# Patient Record
Sex: Male | Born: 1990 | Race: White | Hispanic: No | Marital: Single | Smoking: Former smoker
Health system: Southern US, Community
[De-identification: ages and names within clinical notes are randomized; demographics above are authoritative.]

## PROBLEM LIST (undated history)

## (undated) HISTORY — PX: APPENDECTOMY: SHX54

---

## 2020-09-01 ENCOUNTER — Other Ambulatory Visit: Payer: Self-pay

## 2020-09-01 ENCOUNTER — Observation Stay (HOSPITAL_COMMUNITY)
Admission: EM | Admit: 2020-09-01 | Discharge: 2020-09-02 | Disposition: A | Discharge status: Home / Self Care | Payer: 59 | Attending: Physician Assistant | Admitting: Physician Assistant

## 2020-09-01 ENCOUNTER — Emergency Department (HOSPITAL_COMMUNITY): Payer: 59

## 2020-09-01 ENCOUNTER — Encounter (HOSPITAL_COMMUNITY): Payer: Self-pay

## 2020-09-01 DIAGNOSIS — Z20822 Contact with and (suspected) exposure to covid-19: Secondary | ICD-10-CM | POA: Insufficient documentation

## 2020-09-01 DIAGNOSIS — K3580 Unspecified acute appendicitis: Secondary | ICD-10-CM | POA: Diagnosis not present

## 2020-09-01 DIAGNOSIS — K353 Acute appendicitis with localized peritonitis, without perforation or gangrene: Secondary | ICD-10-CM

## 2020-09-01 DIAGNOSIS — R1031 Right lower quadrant pain: Secondary | ICD-10-CM | POA: Diagnosis present

## 2020-09-01 LAB — CBC WITH DIFFERENTIAL/PLATELET
Abs Immature Granulocytes: 0.04 10*3/uL (ref 0.00–0.07)
Basophils Absolute: 0 10*3/uL (ref 0.0–0.1)
Basophils Relative: 0 %
Eosinophils Absolute: 0 10*3/uL (ref 0.0–0.5)
Eosinophils Relative: 0 %
HCT: 44.4 % (ref 39.0–52.0)
Hemoglobin: 15.6 g/dL (ref 13.0–17.0)
Immature Granulocytes: 0 %
Lymphocytes Relative: 24 %
Lymphs Abs: 2.7 10*3/uL (ref 0.7–4.0)
MCH: 30.8 pg (ref 26.0–34.0)
MCHC: 35.1 g/dL (ref 30.0–36.0)
MCV: 87.6 fL (ref 80.0–100.0)
Monocytes Absolute: 0.7 10*3/uL (ref 0.1–1.0)
Monocytes Relative: 6 %
Neutro Abs: 7.7 10*3/uL (ref 1.7–7.7)
Neutrophils Relative %: 70 %
Platelets: 286 10*3/uL (ref 150–400)
RBC: 5.07 MIL/uL (ref 4.22–5.81)
RDW: 11.9 % (ref 11.5–15.5)
WBC: 11.1 10*3/uL — ABNORMAL HIGH (ref 4.0–10.5)
nRBC: 0 % (ref 0.0–0.2)

## 2020-09-01 LAB — URINALYSIS, ROUTINE W REFLEX MICROSCOPIC
Bilirubin Urine: NEGATIVE
Glucose, UA: NEGATIVE mg/dL
Hgb urine dipstick: NEGATIVE
Ketones, ur: NEGATIVE mg/dL
Leukocytes,Ua: NEGATIVE
Nitrite: NEGATIVE
Protein, ur: NEGATIVE mg/dL
Specific Gravity, Urine: 1.008 (ref 1.005–1.030)
pH: 6 (ref 5.0–8.0)

## 2020-09-01 LAB — COMPREHENSIVE METABOLIC PANEL
ALT: 45 U/L — ABNORMAL HIGH (ref 0–44)
AST: 22 U/L (ref 15–41)
Albumin: 4.6 g/dL (ref 3.5–5.0)
Alkaline Phosphatase: 69 U/L (ref 38–126)
Anion gap: 10 (ref 5–15)
BUN: 6 mg/dL (ref 6–20)
CO2: 28 mmol/L (ref 22–32)
Calcium: 9.9 mg/dL (ref 8.9–10.3)
Chloride: 100 mmol/L (ref 98–111)
Creatinine, Ser: 0.87 mg/dL (ref 0.61–1.24)
GFR, Estimated: >60 mL/min (ref >60)
Glucose, Bld: 96 mg/dL (ref 70–99)
Potassium: 3.3 mmol/L — ABNORMAL LOW (ref 3.5–5.1)
Sodium: 138 mmol/L (ref 135–145)
Total Bilirubin: 1.3 mg/dL — ABNORMAL HIGH (ref 0.3–1.2)
Total Protein: 7.7 g/dL (ref 6.5–8.1)

## 2020-09-01 LAB — LIPASE, BLOOD: Lipase: 24 U/L (ref 11–51)

## 2020-09-01 MED ORDER — KCL IN DEXTROSE-NACL 20-5-0.45 MEQ/L-%-% IV SOLN
INTRAVENOUS | Status: DC
  Filled 2020-09-01: qty 1000

## 2020-09-01 MED ORDER — PIPERACILLIN-TAZOBACTAM 3.375 G IVPB
3.3750 g | Freq: Three times a day (TID) | INTRAVENOUS | Status: DC
  Administered 2020-09-02: 3.375 g via INTRAVENOUS
  Filled 2020-09-01: qty 50

## 2020-09-01 MED ORDER — ONDANSETRON HCL 4 MG/2ML IJ SOLN: 4.0000 mg | Freq: Four times a day (QID) | INTRAMUSCULAR | Status: DC | PRN

## 2020-09-01 MED ORDER — ENOXAPARIN SODIUM 40 MG/0.4ML ~~LOC~~ SOLN: 40.0000 mg | SUBCUTANEOUS | Status: DC

## 2020-09-01 MED ORDER — IOHEXOL 300 MG/ML  SOLN
100.0000 mL | Freq: Once | INTRAMUSCULAR | Status: AC | PRN
  Administered 2020-09-01: 100 mL via INTRAVENOUS

## 2020-09-01 MED ORDER — ACETAMINOPHEN 325 MG PO TABS: 650.0000 mg | ORAL_TABLET | Freq: Four times a day (QID) | ORAL | Status: DC | PRN

## 2020-09-01 MED ORDER — METOPROLOL TARTRATE 5 MG/5ML IV SOLN: 5.0000 mg | Freq: Four times a day (QID) | INTRAVENOUS | Status: DC | PRN

## 2020-09-01 MED ORDER — ONDANSETRON 4 MG PO TBDP: 4.0000 mg | ORAL_TABLET | Freq: Four times a day (QID) | ORAL | Status: DC | PRN

## 2020-09-01 MED ORDER — PIPERACILLIN-TAZOBACTAM 3.375 G IVPB 30 MIN
3.3750 g | Freq: Once | INTRAVENOUS | Status: AC
  Administered 2020-09-01: 3.375 g via INTRAVENOUS
  Filled 2020-09-01: qty 50

## 2020-09-01 MED ORDER — SODIUM CHLORIDE 0.9 % IV BOLUS
1000.0000 mL | Freq: Once | INTRAVENOUS | Status: AC
  Administered 2020-09-01: 1000 mL via INTRAVENOUS

## 2020-09-01 MED ORDER — MORPHINE SULFATE (PF) 2 MG/ML IV SOLN: 2.0000 mg | INTRAVENOUS | Status: DC | PRN

## 2020-09-01 MED ORDER — POLYETHYLENE GLYCOL 3350 17 G PO PACK: 17.0000 g | PACK | Freq: Every day | ORAL | Status: DC | PRN

## 2020-09-01 MED ORDER — OXYCODONE HCL 5 MG PO TABS: 5.0000 mg | ORAL_TABLET | Freq: Four times a day (QID) | ORAL | Status: DC | PRN

## 2020-09-01 MED ORDER — ACETAMINOPHEN 650 MG RE SUPP: 650.0000 mg | Freq: Four times a day (QID) | RECTAL | Status: DC | PRN

## 2020-09-01 NOTE — ED Notes (Signed)
Pt taken to CT.

## 2020-09-01 NOTE — H&P (Signed)
Reason for Consult: abdominal pain Referring Physician: Caydin Yeatts is an 29 y.o. male.  HPI: 29 yo male with 1 day of right sided pain. Pain is dull constant ache. Pain medications help. It is worse with movement. It is not associated with food. He denies nausea or vomiting. He denies diarrhea. He denies fevers.  History reviewed. No pertinent past medical history.  History reviewed. No pertinent surgical history.  No family history on file.  Social History:  has no history on file for tobacco use, alcohol use, and drug use.  Allergies: Not on File  Medications: I have reviewed the patient's current medications.  Results for orders placed or performed during the hospital encounter of 09/01/20 (from the past 48 hour(s))  Comprehensive metabolic panel     Status: Abnormal   Collection Time: 09/01/20  7:00 PM  Result Value Ref Range   Sodium 138 135 - 145 mmol/L   Potassium 3.3 (L) 3.5 - 5.1 mmol/L   Chloride 100 98 - 111 mmol/L   CO2 28 22 - 32 mmol/L   Glucose, Bld 96 70 - 99 mg/dL    Comment: Glucose reference range applies only to samples taken after fasting for at least 8 hours.   BUN 6 6 - 20 mg/dL   Creatinine, Ser 4.85 0.61 - 1.24 mg/dL   Calcium 9.9 8.9 - 46.2 mg/dL   Total Protein 7.7 6.5 - 8.1 g/dL   Albumin 4.6 3.5 - 5.0 g/dL   AST 22 15 - 41 U/L   ALT 45 (H) 0 - 44 U/L   Alkaline Phosphatase 69 38 - 126 U/L   Total Bilirubin 1.3 (H) 0.3 - 1.2 mg/dL   GFR, Estimated >70 >35 mL/min    Comment: (NOTE) Calculated using the CKD-EPI Creatinine Equation (2021)    Anion gap 10 5 - 15    Comment: Performed at [facility], [address] [place], [place] [address]  CBC with Differential     Status: Abnormal   Collection Time: 09/01/20  7:00 PM  Result Value Ref Range   WBC 11.1 (H) 4.0 - 10.5 K/uL   RBC 5.07 4.22 - 5.81 MIL/uL   Hemoglobin 15.6 13.0 - 17.0 g/dL   HCT 18.2 39 - 52 %   MCV 87.6 80.0 - 100.0 fL   MCH 30.8 26.0 - 34.0 pg    MCHC 35.1 30.0 - 36.0 g/dL   RDW 99.3 71.6 - 96.7 %   Platelets 286 150 - 400 K/uL   nRBC 0.0 0.0 - 0.2 %   Neutrophils Relative % 70 %   Neutro Abs 7.7 1.7 - 7.7 K/uL   Lymphocytes Relative 24 %   Lymphs Abs 2.7 0.7 - 4.0 K/uL   Monocytes Relative 6 %   Monocytes Absolute 0.7 0.1 - 1.0 K/uL   Eosinophils Relative 0 %   Eosinophils Absolute 0.0 0.0 - 0.5 K/uL   Basophils Relative 0 %   Basophils Absolute 0.0 0.0 - 0.1 K/uL   Immature Granulocytes 0 %   Abs Immature Granulocytes 0.04 0.00 - 0.07 K/uL    Comment: Performed at [facility], [address] [place], [place] [address]  Lipase, blood     Status: None   Collection Time: 09/01/20  7:00 PM  Result Value Ref Range   Lipase 24 11 - 51 U/L    Comment: Performed at [facility], [address], [place], [place] [address]  Urinalysis,  Routine w reflex microscopic Urine, Clean Catch     Status: None   Collection Time: 09/01/20  7:42 PM  Result Value Ref Range   Color, Urine YELLOW YELLOW   APPearance CLEAR CLEAR   Specific Gravity, Urine 1.008 1.005 - 1.030   pH 6.0 5.0 - 8.0   Glucose, UA NEGATIVE NEGATIVE mg/dL   Hgb urine dipstick NEGATIVE NEGATIVE   Bilirubin Urine NEGATIVE NEGATIVE   Ketones, ur NEGATIVE NEGATIVE mg/dL   Protein, ur NEGATIVE NEGATIVE mg/dL   Nitrite NEGATIVE NEGATIVE   Leukocytes,Ua NEGATIVE NEGATIVE    Comment: Performed at [facility], [address], [place], [place] [address]    CT Abdomen Pelvis W Contrast  Result Date: 09/01/2020 CLINICAL DATA:  Right lower quadrant abdominal pain EXAM: CT ABDOMEN AND PELVIS WITH CONTRAST TECHNIQUE: Multidetector CT imaging of the abdomen and pelvis was performed using the standard protocol following bolus administration of intravenous contrast. CONTRAST:  OMNIPAQUE IOHEXOL 300 MG/ML  SOLN COMPARISON:  None. FINDINGS: Lower chest: The visualized heart size within normal limits. No pericardial fluid/thickening. No hiatal hernia. The  visualized portions of the lungs are clear. Hepatobiliary: The liver is normal in density without focal abnormality.The main portal vein is patent. No evidence of calcified gallstones, gallbladder wall thickening or biliary dilatation. Pancreas: Unremarkable. No pancreatic ductal dilatation or surrounding inflammatory changes. Spleen: Normal in size without focal abnormality. Adrenals/Urinary Tract: Both adrenal glands appear normal. The kidneys and collecting system appear normal without evidence of urinary tract calculus or hydronephrosis. Bladder is unremarkable. Stomach/Bowel: The stomach, small bowel, and colon are normal in appearance. No inflammatory changes, wall thickening, or obstructive findings.The appendix is and large with wall thickening measuring up to 8 mm in transverse dimension. Surrounding fat stranding changes are seen. Scattered small lymph nodes are seen within the right lower quadrant. No loculated fluid collection or free air. Vascular/Lymphatic: There are no enlarged mesenteric, retroperitoneal, or pelvic lymph nodes. No significant vascular findings are present. Reproductive: Other: No evidence of abdominal wall mass or hernia. Musculoskeletal: No acute or significant osseous findings. IMPRESSION: Findings consistent with acute uncomplicated appendicitis. No surrounding loculated fluid collections or free air. Electronically Signed   By: Jonna Clark M.D.   On: 09/01/2020 21:32    ROS  PE Blood pressure (!) 142/99, pulse (!) 104, temperature 99.2 F (37.3 C), temperature source Oral, resp. rate (!) 26, height 5\' 10"  (1.778 m), weight 83.9 kg, SpO2 98 %. Constitutional: NAD; conversant; no deformities Eyes: Moist conjunctiva; no lid lag; anicteric; PERRL Neck: Trachea midline; no thyromegaly Lungs: Normal respiratory effort; no tactile fremitus CV: RRR; no palpable thrills; no pitting edema GI: Abd tender to palpation over right lateral area; no palpable  hepatosplenomegaly MSK: unable to assess gait; no clubbing/cyanosis Psychiatric: Appropriate affect; alert and oriented x3 Lymphatic: No palpable cervical or axillary lymphadenopathy Skin: No major subcutaneous nodules. Warm and dry   Assessment/Plan: 29 yo male with acute appendicitis -IV zosyn -observation -OR in am -COVID screening test -we discussed the details of the procedure; that it would be done under general anesthesia, that we would attempt to do the procedure laparoscopically. That the appendix would be isolated from the large and small intestine and then ligated and removed. We discussed the reason for this is to avoid rupture and infection and resolve the pains. We discussed risks of infection, abscess, injury to intestines or urinary structures, and need for open incision. He showed good understanding and wanted to proceed.  De Blanch Cornelio Parkerson 09/01/2020, 10:40 PM

## 2020-09-01 NOTE — ED Provider Notes (Signed)
[facility] EMERGENCY DEPARTMENT Provider Note   CSN: [id number] Arrival date & time: 09/01/20  1842     History Chief Complaint  Patient presents with  . Abdominal Pain  . Back Pain    Riley Hallum is a 29 y.o. male.  The history is provided by the patient.  Abdominal Pain Back Pain Associated symptoms: abdominal pain    Artha Chiasson is a 29 y.o. male who presents to the Emergency Department complaining of abdominal pain. He presents the emergency department for evaluation of right sided abdominal pain that radiates to his back. Symptoms started early this morning. He has decreased appetite, last eight last night. He denies any fevers, nausea, vomiting, dysuria. Symptoms are moderate and constant nature. He went to urgent care and was referred to the emergency department for possible appendicitis.    History reviewed. No pertinent past medical history.  There are no problems to display for this patient.   History reviewed. No pertinent surgical history.     No family history on file.  Social History   Tobacco Use  . Smoking status: Not on file  Substance Use Topics  . Alcohol use: Not on file  . Drug use: Not on file    Home Medications Prior to Admission medications   Not on File    Allergies    Patient has no allergy information on record.  Review of Systems   Review of Systems  Gastrointestinal: Positive for abdominal pain.  Musculoskeletal: Positive for back pain.  All other systems reviewed and are negative.   Physical Exam Updated Vital Signs BP (!) 142/99   Pulse (!) 104   Temp 99.2 F (37.3 C) (Oral)   Resp (!) 26   Ht 5\' 10"  (1.778 m)   Wt 83.9 kg   SpO2 98%   BMI 26.54 kg/m   Physical Exam Vitals and nursing note reviewed.  Constitutional:      Appearance: He is well-developed.  HENT:     Head: Normocephalic and atraumatic.  Cardiovascular:     Rate and Rhythm: Regular rhythm. Tachycardia present.     Heart  sounds: No murmur heard.   Pulmonary:     Effort: Pulmonary effort is normal. No respiratory distress.     Breath sounds: Normal breath sounds.  Abdominal:     Palpations: Abdomen is soft.     Tenderness: There is abdominal tenderness. There is no guarding or rebound.     Comments: Mild to moderate right lower quadrant and right flank tenderness  Musculoskeletal:        General: No tenderness.  Skin:    General: Skin is warm and dry.  Neurological:     Mental Status: He is alert and oriented to person, place, and time.  Psychiatric:        Behavior: Behavior normal.     ED Results / Procedures / Treatments   Labs (all labs ordered are listed, but only abnormal results are displayed) Labs Reviewed  COMPREHENSIVE METABOLIC PANEL - Abnormal; Notable for the following components:      Result Value   Potassium 3.3 (*)    ALT 45 (*)    Total Bilirubin 1.3 (*)    All other components within normal limits  CBC WITH DIFFERENTIAL/PLATELET - Abnormal; Notable for the following components:   WBC 11.1 (*)    All other components within normal limits  RESPIRATORY PANEL BY RT PCR (FLU A&B, COVID)  URINALYSIS, ROUTINE W REFLEX MICROSCOPIC  LIPASE, BLOOD    EKG None  Radiology CT Abdomen Pelvis W Contrast  Result Date: 09/01/2020 CLINICAL DATA:  Right lower quadrant abdominal pain EXAM: CT ABDOMEN AND PELVIS WITH CONTRAST TECHNIQUE: Multidetector CT imaging of the abdomen and pelvis was performed using the standard protocol following bolus administration of intravenous contrast. CONTRAST:  OMNIPAQUE IOHEXOL 300 MG/ML  SOLN COMPARISON:  None. FINDINGS: Lower chest: The visualized heart size within normal limits. No pericardial fluid/thickening. No hiatal hernia. The visualized portions of the lungs are clear. Hepatobiliary: The liver is normal in density without focal abnormality.The main portal vein is patent. No evidence of calcified gallstones, gallbladder wall thickening or  biliary dilatation. Pancreas: Unremarkable. No pancreatic ductal dilatation or surrounding inflammatory changes. Spleen: Normal in size without focal abnormality. Adrenals/Urinary Tract: Both adrenal glands appear normal. The kidneys and collecting system appear normal without evidence of urinary tract calculus or hydronephrosis. Bladder is unremarkable. Stomach/Bowel: The stomach, small bowel, and colon are normal in appearance. No inflammatory changes, wall thickening, or obstructive findings.The appendix is and large with wall thickening measuring up to 8 mm in transverse dimension. Surrounding fat stranding changes are seen. Scattered small lymph nodes are seen within the right lower quadrant. No loculated fluid collection or free air. Vascular/Lymphatic: There are no enlarged mesenteric, retroperitoneal, or pelvic lymph nodes. No significant vascular findings are present. Reproductive: Other: No evidence of abdominal wall mass or hernia. Musculoskeletal: No acute or significant osseous findings. IMPRESSION: Findings consistent with acute uncomplicated appendicitis. No surrounding loculated fluid collections or free air. Electronically Signed   By: Jonna Clark M.D.   On: 09/01/2020 21:32    Procedures Procedures (including critical care time)  Medications Ordered in ED Medications  piperacillin-tazobactam (ZOSYN) IVPB 3.375 g (has no administration in time range)  sodium chloride 0.9 % bolus 1,000 mL (has no administration in time range)  iohexol (OMNIPAQUE) 300 MG/ML solution 100 mL (100 mLs Intravenous Contrast Given 09/01/20 2121)    ED Course  I have reviewed the triage vital signs and the nursing notes.  Pertinent labs & imaging results that were available during my care of the patient were reviewed by me and considered in my medical decision making (see chart for details).    MDM Rules/Calculators/A&P                         patient here for evaluation of abdominal pain since this  morning. He has tenderness on examination. CBC with mild leukocytosis. CT abdomen pelvis obtained, which is concerning for acute uncomplicated appendicitis. Discussed with patient findings of studies. Will treat with antibiotics, IV fluids. Surgery consulted for admission for ongoing treatment.  Final Clinical Impression(s) / ED Diagnoses Final diagnoses:  None    Rx / DC Orders ED Discharge Orders    None       Tilden Fossa, MD 09/01/20 2205

## 2020-09-01 NOTE — ED Triage Notes (Signed)
Pt sent from UC for further evaluation of right sided abd pain that radiates to his back, concerned for appendicitis. No n/v, but no appetite.

## 2020-09-02 ENCOUNTER — Observation Stay (HOSPITAL_COMMUNITY): Payer: 59 | Admitting: Certified Registered Nurse Anesthetist

## 2020-09-02 ENCOUNTER — Encounter (HOSPITAL_COMMUNITY): Payer: Self-pay

## 2020-09-02 ENCOUNTER — Encounter (HOSPITAL_COMMUNITY)
Admission: EM | Disposition: A | Discharge status: Home / Self Care | Payer: Self-pay | Source: Home / Self Care | Attending: Emergency Medicine

## 2020-09-02 HISTORY — PX: LAPAROSCOPIC APPENDECTOMY: SHX408

## 2020-09-02 LAB — SURGICAL PCR SCREEN
MRSA, PCR: NEGATIVE
Staphylococcus aureus: NEGATIVE

## 2020-09-02 LAB — CBC
HCT: 39.9 % (ref 39.0–52.0)
HCT: 43.4 % (ref 39.0–52.0)
Hemoglobin: 14.3 g/dL (ref 13.0–17.0)
Hemoglobin: 15.1 g/dL (ref 13.0–17.0)
MCH: 31.6 pg (ref 26.0–34.0)
MCH: 30.5 pg (ref 26.0–34.0)
MCHC: 35.8 g/dL (ref 30.0–36.0)
MCHC: 34.8 g/dL (ref 30.0–36.0)
MCV: 88.1 fL (ref 80.0–100.0)
MCV: 87.7 fL (ref 80.0–100.0)
Platelets: 234 10*3/uL (ref 150–400)
Platelets: 266 10*3/uL (ref 150–400)
RBC: 4.53 MIL/uL (ref 4.22–5.81)
RBC: 4.95 MIL/uL (ref 4.22–5.81)
RDW: 12 % (ref 11.5–15.5)
RDW: 12 % (ref 11.5–15.5)
WBC: 8.4 10*3/uL (ref 4.0–10.5)
WBC: 11.1 10*3/uL — ABNORMAL HIGH (ref 4.0–10.5)
nRBC: 0 % (ref 0.0–0.2)
nRBC: 0 % (ref 0.0–0.2)

## 2020-09-02 LAB — COMPREHENSIVE METABOLIC PANEL
ALT: 39 U/L (ref 0–44)
AST: 22 U/L (ref 15–41)
Albumin: 3.8 g/dL (ref 3.5–5.0)
Alkaline Phosphatase: 53 U/L (ref 38–126)
Anion gap: 9 (ref 5–15)
BUN: 6 mg/dL (ref 6–20)
CO2: 26 mmol/L (ref 22–32)
Calcium: 9.1 mg/dL (ref 8.9–10.3)
Chloride: 104 mmol/L (ref 98–111)
Creatinine, Ser: 0.84 mg/dL (ref 0.61–1.24)
GFR, Estimated: >60 mL/min (ref >60)
Glucose, Bld: 123 mg/dL — ABNORMAL HIGH (ref 70–99)
Potassium: 3.5 mmol/L (ref 3.5–5.1)
Sodium: 139 mmol/L (ref 135–145)
Total Bilirubin: 1 mg/dL (ref 0.3–1.2)
Total Protein: 6.3 g/dL — ABNORMAL LOW (ref 6.5–8.1)

## 2020-09-02 LAB — CREATININE, SERUM
Creatinine, Ser: 0.81 mg/dL (ref 0.61–1.24)
GFR, Estimated: >60 mL/min (ref >60)

## 2020-09-02 LAB — RESPIRATORY PANEL BY RT PCR (FLU A&B, COVID)
Influenza A by PCR: NEGATIVE
Influenza B by PCR: NEGATIVE
SARS Coronavirus 2 by RT PCR: NEGATIVE

## 2020-09-02 LAB — HIV ANTIBODY (ROUTINE TESTING W REFLEX): HIV Screen 4th Generation wRfx: NONREACTIVE

## 2020-09-02 SURGERY — APPENDECTOMY, LAPAROSCOPIC
Anesthesia: General | Site: Abdomen

## 2020-09-02 MED ORDER — HYDROCODONE-ACETAMINOPHEN 5-325 MG PO TABS: 1.0000 | ORAL_TABLET | Freq: Four times a day (QID) | ORAL | 0 refills | Status: DC | PRN

## 2020-09-02 MED ORDER — ENSURE PRE-SURGERY PO LIQD
296.0000 mL | Freq: Once | ORAL | Status: AC
  Administered 2020-09-02: 296 mL via ORAL
  Filled 2020-09-02: qty 296

## 2020-09-02 MED ORDER — HYDROMORPHONE HCL 1 MG/ML IJ SOLN: 0.2500 mg | INTRAMUSCULAR | Status: DC | PRN

## 2020-09-02 MED ORDER — DIPHENHYDRAMINE HCL 50 MG/ML IJ SOLN: 25.0000 mg | Freq: Four times a day (QID) | INTRAMUSCULAR | Status: DC | PRN

## 2020-09-02 MED ORDER — PROMETHAZINE HCL 25 MG/ML IJ SOLN: 6.2500 mg | INTRAMUSCULAR | Status: DC | PRN

## 2020-09-02 MED ORDER — POLYETHYLENE GLYCOL 3350 17 G PO PACK: 17.0000 g | PACK | Freq: Every day | ORAL | Status: DC | PRN

## 2020-09-02 MED ORDER — OXYCODONE HCL 5 MG PO TABS
ORAL_TABLET | ORAL | Status: AC
  Filled 2020-09-02: qty 1

## 2020-09-02 MED ORDER — SUGAMMADEX SODIUM 200 MG/2ML IV SOLN
INTRAVENOUS | Status: DC | PRN
  Administered 2020-09-02: 200 mg via INTRAVENOUS
  Administered 2020-09-02: 100 mg via INTRAVENOUS

## 2020-09-02 MED ORDER — FENTANYL CITRATE (PF) 250 MCG/5ML IJ SOLN
INTRAMUSCULAR | Status: AC
  Filled 2020-09-02: qty 5

## 2020-09-02 MED ORDER — HYDROMORPHONE HCL 1 MG/ML IJ SOLN
INTRAMUSCULAR | Status: AC
  Filled 2020-09-02: qty 1

## 2020-09-02 MED ORDER — PROPOFOL 10 MG/ML IV BOLUS
INTRAVENOUS | Status: AC
  Filled 2020-09-02: qty 20

## 2020-09-02 MED ORDER — IBUPROFEN 600 MG PO TABS: 600.0000 mg | ORAL_TABLET | Freq: Four times a day (QID) | ORAL | Status: DC | PRN

## 2020-09-02 MED ORDER — OXYCODONE HCL 5 MG PO TABS: 5.0000 mg | ORAL_TABLET | ORAL | Status: DC | PRN

## 2020-09-02 MED ORDER — FENTANYL CITRATE (PF) 250 MCG/5ML IJ SOLN
INTRAMUSCULAR | Status: DC | PRN
  Administered 2020-09-02: 50 ug via INTRAVENOUS
  Administered 2020-09-02: 100 ug via INTRAVENOUS

## 2020-09-02 MED ORDER — LACTATED RINGERS IV SOLN: INTRAVENOUS | Status: DC | PRN

## 2020-09-02 MED ORDER — ACETAMINOPHEN 500 MG PO TABS: 500.0000 mg | ORAL_TABLET | Freq: Four times a day (QID) | ORAL | Status: AC | PRN

## 2020-09-02 MED ORDER — 0.9 % SODIUM CHLORIDE (POUR BTL) OPTIME
TOPICAL | Status: DC | PRN
  Administered 2020-09-02: 1000 mL

## 2020-09-02 MED ORDER — MIDAZOLAM HCL 2 MG/2ML IJ SOLN
INTRAMUSCULAR | Status: AC
  Filled 2020-09-02: qty 2

## 2020-09-02 MED ORDER — MIDAZOLAM HCL 5 MG/5ML IJ SOLN
INTRAMUSCULAR | Status: DC | PRN
  Administered 2020-09-02: 2 mg via INTRAVENOUS

## 2020-09-02 MED ORDER — KETOROLAC TROMETHAMINE 15 MG/ML IJ SOLN
15.0000 mg | INTRAMUSCULAR | Status: AC
  Administered 2020-09-02: 15 mg via INTRAVENOUS
  Filled 2020-09-02: qty 1

## 2020-09-02 MED ORDER — SIMETHICONE 80 MG PO CHEW: 40.0000 mg | CHEWABLE_TABLET | Freq: Four times a day (QID) | ORAL | Status: DC | PRN

## 2020-09-02 MED ORDER — MORPHINE SULFATE (PF) 2 MG/ML IV SOLN: 2.0000 mg | INTRAVENOUS | Status: DC | PRN

## 2020-09-02 MED ORDER — SODIUM CHLORIDE 0.9 % IR SOLN
Status: DC | PRN
  Administered 2020-09-02: 1000 mL

## 2020-09-02 MED ORDER — LACTATED RINGERS IV SOLN: INTRAVENOUS | Status: DC

## 2020-09-02 MED ORDER — DEXMEDETOMIDINE (PRECEDEX) IN NS 20 MCG/5ML (4 MCG/ML) IV SYRINGE
PREFILLED_SYRINGE | INTRAVENOUS | Status: AC
  Filled 2020-09-02: qty 5

## 2020-09-02 MED ORDER — CHLORHEXIDINE GLUCONATE 0.12 % MT SOLN
15.0000 mL | OROMUCOSAL | Status: DC
  Filled 2020-09-02: qty 15

## 2020-09-02 MED ORDER — DIPHENHYDRAMINE HCL 25 MG PO CAPS: 25.0000 mg | ORAL_CAPSULE | Freq: Four times a day (QID) | ORAL | Status: DC | PRN

## 2020-09-02 MED ORDER — GABAPENTIN 300 MG PO CAPS: 300.0000 mg | ORAL_CAPSULE | Freq: Two times a day (BID) | ORAL | Status: DC

## 2020-09-02 MED ORDER — KETOROLAC TROMETHAMINE 30 MG/ML IJ SOLN: 30.0000 mg | Freq: Once | INTRAMUSCULAR | Status: DC | PRN

## 2020-09-02 MED ORDER — ACETAMINOPHEN 500 MG PO TABS
1000.0000 mg | ORAL_TABLET | ORAL | Status: AC
  Administered 2020-09-02: 1000 mg via ORAL
  Filled 2020-09-02: qty 2

## 2020-09-02 MED ORDER — BUPIVACAINE HCL 0.25 % IJ SOLN
INTRAMUSCULAR | Status: DC | PRN
  Administered 2020-09-02: 10 mL

## 2020-09-02 MED ORDER — METOPROLOL TARTRATE 5 MG/5ML IV SOLN: 5.0000 mg | Freq: Four times a day (QID) | INTRAVENOUS | Status: DC | PRN

## 2020-09-02 MED ORDER — LIDOCAINE 2% (20 MG/ML) 5 ML SYRINGE
INTRAMUSCULAR | Status: DC | PRN
  Administered 2020-09-02: 40 mg via INTRAVENOUS

## 2020-09-02 MED ORDER — ACETAMINOPHEN 500 MG PO TABS: 1000.0000 mg | ORAL_TABLET | Freq: Four times a day (QID) | ORAL | Status: DC

## 2020-09-02 MED ORDER — CHLORHEXIDINE GLUCONATE 0.12 % MT SOLN
OROMUCOSAL | Status: AC
  Administered 2020-09-02: 15 mL via OROMUCOSAL
  Filled 2020-09-02: qty 15

## 2020-09-02 MED ORDER — OXYCODONE HCL 5 MG PO TABS: 5.0000 mg | ORAL_TABLET | Freq: Once | ORAL | Status: DC | PRN

## 2020-09-02 MED ORDER — CHLORHEXIDINE GLUCONATE 0.12 % MT SOLN: 15.0000 mL | Freq: Once | OROMUCOSAL | Status: AC

## 2020-09-02 MED ORDER — MEPERIDINE HCL 25 MG/ML IJ SOLN: 6.2500 mg | INTRAMUSCULAR | Status: DC | PRN

## 2020-09-02 MED ORDER — KCL IN DEXTROSE-NACL 20-5-0.45 MEQ/L-%-% IV SOLN: INTRAVENOUS | Status: DC

## 2020-09-02 MED ORDER — ENOXAPARIN SODIUM 40 MG/0.4ML ~~LOC~~ SOLN: 40.0000 mg | SUBCUTANEOUS | Status: DC

## 2020-09-02 MED ORDER — ROCURONIUM BROMIDE 100 MG/10ML IV SOLN
INTRAVENOUS | Status: DC | PRN
  Administered 2020-09-02: 60 mg via INTRAVENOUS

## 2020-09-02 MED ORDER — BUPIVACAINE HCL (PF) 0.25 % IJ SOLN
INTRAMUSCULAR | Status: AC
  Filled 2020-09-02: qty 30

## 2020-09-02 MED ORDER — OXYCODONE HCL 5 MG/5ML PO SOLN: 5.0000 mg | Freq: Once | ORAL | Status: DC | PRN

## 2020-09-02 MED ORDER — PROPOFOL 10 MG/ML IV BOLUS
INTRAVENOUS | Status: DC | PRN
  Administered 2020-09-02: 200 mg via INTRAVENOUS

## 2020-09-02 MED ORDER — ONDANSETRON 4 MG PO TBDP: 4.0000 mg | ORAL_TABLET | Freq: Four times a day (QID) | ORAL | Status: DC | PRN

## 2020-09-02 MED ORDER — DEXAMETHASONE SODIUM PHOSPHATE 10 MG/ML IJ SOLN
INTRAMUSCULAR | Status: DC | PRN
  Administered 2020-09-02: 5 mg via INTRAVENOUS

## 2020-09-02 MED ORDER — GABAPENTIN 300 MG PO CAPS
300.0000 mg | ORAL_CAPSULE | ORAL | Status: AC
  Administered 2020-09-02: 300 mg via ORAL
  Filled 2020-09-02: qty 1

## 2020-09-02 MED ORDER — ONDANSETRON HCL 4 MG/2ML IJ SOLN
INTRAMUSCULAR | Status: DC | PRN
  Administered 2020-09-02: 4 mg via INTRAVENOUS

## 2020-09-02 MED ORDER — DEXMEDETOMIDINE (PRECEDEX) IN NS 20 MCG/5ML (4 MCG/ML) IV SYRINGE
PREFILLED_SYRINGE | INTRAVENOUS | Status: DC | PRN
  Administered 2020-09-02: 8 ug via INTRAVENOUS

## 2020-09-02 MED ORDER — ONDANSETRON HCL 4 MG/2ML IJ SOLN: 4.0000 mg | Freq: Four times a day (QID) | INTRAMUSCULAR | Status: DC | PRN

## 2020-09-02 SURGICAL SUPPLY — 40 items
APPLIER CLIP 5 13 M/L LIGAMAX5 (MISCELLANEOUS)
BLADE CLIPPER SURG (BLADE) ×3 IMPLANT
CANISTER SUCT 3000ML PPV (MISCELLANEOUS) ×3 IMPLANT
CHLORAPREP W/TINT 26 (MISCELLANEOUS) ×3 IMPLANT
CLIP APPLIE 5 13 M/L LIGAMAX5 (MISCELLANEOUS) IMPLANT
CLIP VESOLOCK XL 6/CT (CLIP) ×3 IMPLANT
COVER SURGICAL LIGHT HANDLE (MISCELLANEOUS) ×3 IMPLANT
COVER TRANSDUCER ULTRASND (DRAPES) ×3 IMPLANT
DERMABOND ADVANCED (GAUZE/BANDAGES/DRESSINGS) ×2
DERMABOND ADVANCED .7 DNX12 (GAUZE/BANDAGES/DRESSINGS) ×1 IMPLANT
ELECT REM PT RETURN 9FT ADLT (ELECTROSURGICAL) ×3
ELECTRODE REM PT RTRN 9FT ADLT (ELECTROSURGICAL) ×1 IMPLANT
ENDOLOOP SUT PDS II  0 18 (SUTURE)
ENDOLOOP SUT PDS II 0 18 (SUTURE) IMPLANT
GLOVE BIO SURGEON STRL SZ7.5 (GLOVE) ×3 IMPLANT
GOWN STRL REUS W/ TWL LRG LVL3 (GOWN DISPOSABLE) ×2 IMPLANT
GOWN STRL REUS W/ TWL XL LVL3 (GOWN DISPOSABLE) ×1 IMPLANT
GOWN STRL REUS W/TWL LRG LVL3 (GOWN DISPOSABLE) ×4
GOWN STRL REUS W/TWL XL LVL3 (GOWN DISPOSABLE) ×2
GRASPER SUT TROCAR 14GX15 (MISCELLANEOUS) ×3 IMPLANT
KIT BASIN OR (CUSTOM PROCEDURE TRAY) ×3 IMPLANT
KIT TURNOVER KIT B (KITS) ×3 IMPLANT
NEEDLE INSUFFLATION 14GA 120MM (NEEDLE) ×3 IMPLANT
NS IRRIG 1000ML POUR BTL (IV SOLUTION) ×3 IMPLANT
PAD ARMBOARD 7.5X6 YLW CONV (MISCELLANEOUS) ×6 IMPLANT
SCISSORS LAP 5X35 DISP (ENDOMECHANICALS) ×3 IMPLANT
SET IRRIG TUBING LAPAROSCOPIC (IRRIGATION / IRRIGATOR) ×3 IMPLANT
SET TUBE SMOKE EVAC HIGH FLOW (TUBING) ×3 IMPLANT
SLEEVE ENDOPATH XCEL 5M (ENDOMECHANICALS) ×3 IMPLANT
SPECIMEN JAR SMALL (MISCELLANEOUS) ×3 IMPLANT
SUT MNCRL AB 4-0 PS2 18 (SUTURE) ×3 IMPLANT
TOWEL GREEN STERILE (TOWEL DISPOSABLE) ×3 IMPLANT
TOWEL GREEN STERILE FF (TOWEL DISPOSABLE) ×3 IMPLANT
TRAY FOLEY W/BAG SLVR 16FR (SET/KITS/TRAYS/PACK) ×2
TRAY FOLEY W/BAG SLVR 16FR ST (SET/KITS/TRAYS/PACK) ×1 IMPLANT
TRAY LAPAROSCOPIC MC (CUSTOM PROCEDURE TRAY) ×3 IMPLANT
TROCAR XCEL NON-BLD 11X100MML (ENDOMECHANICALS) ×3 IMPLANT
TROCAR XCEL NON-BLD 5MMX100MML (ENDOMECHANICALS) ×3 IMPLANT
WARMER LAPAROSCOPE (MISCELLANEOUS) ×3 IMPLANT
WATER STERILE IRR 1000ML POUR (IV SOLUTION) ×3 IMPLANT

## 2020-09-02 NOTE — Anesthesia Preprocedure Evaluation (Addendum)
Anesthesia Evaluation  Patient identified by MRN, date of birth, ID band Patient awake    Reviewed: Allergy & Precautions, NPO status , Patient's Chart, lab work & pertinent test results  Airway Mallampati: I  TM Distance: >3 FB Neck ROM: Full    Dental no notable dental hx. (+) Teeth Intact, Dental Advisory Given   Pulmonary neg pulmonary ROS,    Pulmonary exam normal breath sounds clear to auscultation       Cardiovascular negative cardio ROS Normal cardiovascular exam Rhythm:Regular Rate:Normal     Neuro/Psych negative neurological ROS  negative psych ROS   GI/Hepatic Neg liver ROS, Acute appendicitis    Endo/Other  negative endocrine ROS  Renal/GU negative Renal ROS  negative genitourinary   Musculoskeletal negative musculoskeletal ROS (+)   Abdominal   Peds  Hematology negative hematology ROS (+)   Anesthesia Other Findings   Reproductive/Obstetrics negative OB ROS                            Anesthesia Physical Anesthesia Plan  ASA: I  Anesthesia Plan: General   Post-op Pain Management:    Induction: Intravenous  PONV Risk Score and Plan: 3 and Ondansetron, Dexamethasone, Midazolam and Treatment may vary due to age or medical condition  Airway Management Planned: Oral ETT  Additional Equipment: None  Intra-op Plan:   Post-operative Plan: Extubation in OR  Informed Consent: I have reviewed the patients History and Physical, chart, labs and discussed the procedure including the risks, benefits and alternatives for the proposed anesthesia with the patient or authorized representative who has indicated his/her understanding and acceptance.     Dental advisory given  Plan Discussed with: CRNA  Anesthesia Plan Comments:        Anesthesia Quick Evaluation

## 2020-09-02 NOTE — Progress Notes (Signed)
Day of Surgery   Subjective/Chief Complaint: Pt doing well today   Objective: Vital signs in last 24 hours: Temp:  [98.2 F (36.8 C)-99.2 F (37.3 C)] 98.2 F (36.8 C) (11/17 0505) Pulse Rate:  [72-112] 72 (11/17 0505) Resp:  [14-26] 19 (11/17 0505) BP: (92-157)/(55-113) 115/73 (11/17 0505) SpO2:  [96 %-100 %] 99 % (11/17 0505) Weight:  [83.9 kg] 83.9 kg (11/16 1850) Last BM Date: 09/01/20  Intake/Output from previous day: 11/16 0701 - 11/17 0700 In: 1507.5 [P.O.:237; I.V.:538.4; IV Piggyback:732.1] Out: 400 [Urine:400] Intake/Output this shift: No intake/output data recorded.  PE:  Constitutional: No acute distress, conversant, appears states age. Eyes: Anicteric sclerae, moist conjunctiva, no lid lag Lungs: Clear to auscultation bilaterally, normal respiratory effort CV: regular rate and rhythm, no murmurs, no peripheral edema, pedal pulses 2+ GI: Soft, no masses or hepatosplenomegaly,tender to palpation R lateral side Skin: No rashes, palpation reveals normal turgor Psychiatric: appropriate judgment and insight, oriented to person, place, and time   Lab Results:  Recent Labs    09/01/20 1900 09/02/20 0353  WBC 11.1* 8.4  HGB 15.6 14.3  HCT 44.4 39.9  PLT 286 234   BMET Recent Labs    09/01/20 1900 09/02/20 0353  NA 138 139  K 3.3* 3.5  CL 100 104  CO2 28 26  GLUCOSE 96 123*  BUN 6 6  CREATININE 0.87 0.84  CALCIUM 9.9 9.1   PT/INR No results for input(s): LABPROT, INR in the last 72 hours. ABG No results for input(s): PHART, HCO3 in the last 72 hours.  Invalid input(s): PCO2, PO2  Studies/Results: CT Abdomen Pelvis W Contrast  Result Date: 09/01/2020 CLINICAL DATA:  Right lower quadrant abdominal pain EXAM: CT ABDOMEN AND PELVIS WITH CONTRAST TECHNIQUE: Multidetector CT imaging of the abdomen and pelvis was performed using the standard protocol following bolus administration of intravenous contrast. CONTRAST:  OMNIPAQUE IOHEXOL 300 MG/ML   SOLN COMPARISON:  None. FINDINGS: Lower chest: The visualized heart size within normal limits. No pericardial fluid/thickening. No hiatal hernia. The visualized portions of the lungs are clear. Hepatobiliary: The liver is normal in density without focal abnormality.The main portal vein is patent. No evidence of calcified gallstones, gallbladder wall thickening or biliary dilatation. Pancreas: Unremarkable. No pancreatic ductal dilatation or surrounding inflammatory changes. Spleen: Normal in size without focal abnormality. Adrenals/Urinary Tract: Both adrenal glands appear normal. The kidneys and collecting system appear normal without evidence of urinary tract calculus or hydronephrosis. Bladder is unremarkable. Stomach/Bowel: The stomach, small bowel, and colon are normal in appearance. No inflammatory changes, wall thickening, or obstructive findings.The appendix is and large with wall thickening measuring up to 8 mm in transverse dimension. Surrounding fat stranding changes are seen. Scattered small lymph nodes are seen within the right lower quadrant. No loculated fluid collection or free air. Vascular/Lymphatic: There are no enlarged mesenteric, retroperitoneal, or pelvic lymph nodes. No significant vascular findings are present. Reproductive: Other: No evidence of abdominal wall mass or hernia. Musculoskeletal: No acute or significant osseous findings. IMPRESSION: Findings consistent with acute uncomplicated appendicitis. No surrounding loculated fluid collections or free air. Electronically Signed   By: Jonna Clark M.D.   On: 09/01/2020 21:32    Anti-infectives: Anti-infectives (From admission, onward)   Start     Dose/Rate Route Frequency Ordered Stop   09/02/20 0600  piperacillin-tazobactam (ZOSYN) IVPB 3.375 g        3.375 g 12.5 mL/hr over 240 Minutes Intravenous Every 8 hours 09/01/20 2240  09/01/20 2145  piperacillin-tazobactam (ZOSYN) IVPB 3.375 g        3.375 g 100 mL/hr over 30  Minutes Intravenous  Once 09/01/20 2142 09/01/20 2259      Assessment/Plan: 53M with acute appendicitis -to OR for lap appy. -I discussed with the patient the risks benefits of the procedure to include but not limited to: Infection, bleeding, damage to surrounding structures, possible ileus, possible postoperative infection. Patient voiced understanding and wishes to proceed.    LOS: 0 days    Axel Filler 09/02/2020

## 2020-09-02 NOTE — Transfer of Care (Signed)
Immediate Anesthesia Transfer of Care Note  Patient: Dylan Byrd  Procedure(s) Performed: APPENDECTOMY LAPAROSCOPIC (N/A Abdomen)  Patient Location: PACU  Anesthesia Type:General  Level of Consciousness: drowsy and patient cooperative  Airway & Oxygen Therapy: Patient Spontanous Breathing and Patient connected to face mask oxygen  Post-op Assessment: Report given to RN and Post -op Vital signs reviewed and stable  Post vital signs: Reviewed  Last Vitals:  Vitals Value Taken Time  BP 119/88 09/02/20 1114  Temp    Pulse 81 09/02/20 1115  Resp 22 09/02/20 1115  SpO2 97 % 09/02/20 1115  Vitals shown include unvalidated device data.  Last Pain:  Vitals:   09/02/20 0626  TempSrc:   PainSc: Asleep         Complications: No complications documented.

## 2020-09-02 NOTE — Progress Notes (Signed)
Received patient via wheelchair from ED. Patient ambulatory, VSS, denies pain, ambulated to bathroom to void, oriented to room, bed controls, call light and plan of care.  Preop procedure done and patient is on ERAS pathway.  Patient now lying on bed comfortably, SCD on and IS use explained and patient did 2500 on the IS.

## 2020-09-02 NOTE — Discharge Instructions (Signed)
[facility] LAPAROSCOPIC SURGERY: POST OP INSTRUCTIONS Always review your discharge instruction sheet given to you by the facility where your surgery was performed. IF YOU HAVE DISABILITY OR FAMILY LEAVE FORMS, YOU MUST BRING THEM TO THE OFFICE FOR PROCESSING.   DO NOT GIVE THEM TO YOUR DOCTOR.  PAIN CONTROL  1. First take acetaminophen (Tylenol) AND/or ibuprofen (Advil) to control your pain after surgery.  Follow directions on package.  Taking acetaminophen (Tylenol) and/or ibuprofen (Advil) regularly after surgery will help to control your pain and lower the amount of prescription pain medication you may need.  You should not take more than 3,000 mg (3 grams) of acetaminophen (Tylenol) in 24 hours.  You should not take ibuprofen (Advil), aleve, motrin, naprosyn or other NSAIDS if you have a history of stomach ulcers or chronic kidney disease.  2. A prescription for pain medication may be given to you upon discharge.  Take your pain medication as prescribed, if you still have uncontrolled pain after taking acetaminophen (Tylenol) or ibuprofen (Advil). 3. Use ice packs to help control pain. 4. If you need a refill on your pain medication, please contact your pharmacy.  They will contact our office to request authorization. Prescriptions will not be filled after 5pm or on week-ends.  HOME MEDICATIONS 5. Take your usually prescribed medications unless otherwise directed.  DIET 6. You should follow a light diet the first few days after arrival home.  Be sure to include lots of fluids daily. Avoid fatty, fried foods.   CONSTIPATION 7. It is common to experience some constipation after surgery and if you are taking pain medication.  Increasing fluid intake and taking a stool softener (such as Colace) will usually help or prevent this problem from occurring.  A mild laxative (Milk of Magnesia or Miralax) should be taken according to package instructions if there are no bowel  movements after 48 hours.  WOUND/INCISION CARE 8. Most patients will experience some swelling and bruising in the area of the incisions.  Ice packs will help.  Swelling and bruising can take several days to resolve.  9. Unless discharge instructions indicate otherwise, follow guidelines below  a. STERI-STRIPS - you may remove your outer bandages 48 hours after surgery, and you may shower at that time.  You have steri-strips (small skin tapes) in place directly over the incision.  These strips should be left on the skin for 7-10 days.   b. DERMABOND/SKIN GLUE - you may shower in 24 hours.  The glue will flake off over the next 2-3 weeks. 10. Any sutures or staples will be removed at the office during your follow-up visit.  ACTIVITIES 11. You may resume regular (light) daily activities beginning the next day--such as daily self-care, walking, climbing stairs--gradually increasing activities as tolerated.  You may have sexual intercourse when it is comfortable.  Refrain from any heavy lifting or straining until approved by your doctor. a. You may drive when you are no longer taking prescription pain medication, you can comfortably wear a seatbelt, and you can safely maneuver your car and apply brakes.  FOLLOW-UP 12. You should see your doctor in the office for a follow-up appointment approximately 2-3 weeks after your surgery.  You should have been given your post-op/follow-up appointment when your surgery was scheduled.  If you did not receive a post-op/follow-up appointment, make sure that you call for this appointment within a day or two after you arrive home to insure a convenient appointment time.     WHEN TO CALL YOUR DOCTOR: 1. Fever over 101.0 2. Inability to urinate 3. Continued bleeding from incision. 4. Increased pain, redness, or drainage from the incision. 5. Increasing abdominal pain  The clinic staff is available to answer your questions during regular business hours.  Please don't  hesitate to call and ask to speak to one of the nurses for clinical concerns.  If you have a medical emergency, go to the nearest emergency room or call 911.  A surgeon from [facility] is always on call at the hospital. [address], [address], [place], [place]  [address] ? [address] [address], [place], [place]   [address] [phone] ? [phone] ? FAX [phone] Web site: [email or web address]  .........   Managing Your Pain After Surgery Without Opioids    Thank you for participating in our program to help patients manage their pain after surgery without opioids. This is part of our effort to provide you with the best care possible, without exposing you or your family to the risk that opioids pose.  What pain can I expect after surgery? You can expect to have some pain after surgery. This is normal. The pain is typically worse the day after surgery, and quickly begins to get better. Many studies have found that many patients are able to manage their pain after surgery with Over-the-Counter (OTC) medications such as Tylenol and Motrin. If you have a condition that does not allow you to take Tylenol or Motrin, notify your surgical team.  How will I manage my pain? The best strategy for controlling your pain after surgery is around the clock pain control with Tylenol (acetaminophen) and Motrin (ibuprofen or Advil). Alternating these medications with each other allows you to maximize your pain control. In addition to Tylenol and Motrin, you can use heating pads or ice packs on your incisions to help reduce your pain.  How will I alternate your regular strength over-the-counter pain medication? You will take a dose of pain medication every three hours. ; Start by taking 650 mg of Tylenol (2 pills of 325 mg) ; 3 hours later take 600 mg of Motrin (3 pills of 200 mg) ; 3 hours after taking the Motrin take 650 mg of Tylenol ; 3 hours after that take 600 mg of  Motrin.   - 1 -  See example - if your first dose of Tylenol is at 12:00 PM   12:00 PM Tylenol 650 mg (2 pills of 325 mg)  3:00 PM Motrin 600 mg (3 pills of 200 mg)  6:00 PM Tylenol 650 mg (2 pills of 325 mg)  9:00 PM Motrin 600 mg (3 pills of 200 mg)  Continue alternating every 3 hours   We recommend that you follow this schedule around-the-clock for at least 3 days after surgery, or until you feel that it is no longer needed. Use the table on the last page of this handout to keep track of the medications you are taking. Important: Do not take more than 3000mg of Tylenol or 3200mg of Motrin in a 24-hour period. Do not take ibuprofen/Motrin if you have a history of bleeding stomach ulcers, severe kidney disease, &/or actively taking a blood thinner  What if I still have pain? If you have pain that is not controlled with the over-the-counter pain medications (Tylenol and Motrin or Advil) you might have what we call "breakthrough" pain. You will receive a prescription for a small amount of an opioid pain medication such as   Oxycodone, Tramadol, or Tylenol with Codeine. Use these opioid pills in the first 24 hours after surgery if you have breakthrough pain. Do not take more than 1 pill every 4-6 hours.  If you still have uncontrolled pain after using all opioid pills, don't hesitate to call our staff using the number provided. We will help make sure you are managing your pain in the best way possible, and if necessary, we can provide a prescription for additional pain medication.   Day 1    Time  Name of Medication Number of pills taken  Amount of Acetaminophen  Pain Level   Comments  AM PM       AM PM       AM PM       AM PM       AM PM       AM PM       AM PM       AM PM       Total Daily amount of Acetaminophen Do not take more than  3,000 mg per day      Day 2    Time  Name of Medication Number of pills taken  Amount of Acetaminophen  Pain Level   Comments  AM  PM       AM PM       AM PM       AM PM       AM PM       AM PM       AM PM       AM PM       Total Daily amount of Acetaminophen Do not take more than  3,000 mg per day      Day 3    Time  Name of Medication Number of pills taken  Amount of Acetaminophen  Pain Level   Comments  AM PM       AM PM       AM PM       AM PM          AM PM       AM PM       AM PM       AM PM       Total Daily amount of Acetaminophen Do not take more than  3,000 mg per day      Day 4    Time  Name of Medication Number of pills taken  Amount of Acetaminophen  Pain Level   Comments  AM PM       AM PM       AM PM       AM PM       AM PM       AM PM       AM PM       AM PM       Total Daily amount of Acetaminophen Do not take more than  3,000 mg per day      Day 5    Time  Name of Medication Number of pills taken  Amount of Acetaminophen  Pain Level   Comments  AM PM       AM PM       AM PM       AM PM       AM PM       AM PM       AM PM         AM PM       Total Daily amount of Acetaminophen Do not take more than  3,000 mg per day       Day 6    Time  Name of Medication Number of pills taken  Amount of Acetaminophen  Pain Level  Comments  AM PM       AM PM       AM PM       AM PM       AM PM       AM PM       AM PM       AM PM       Total Daily amount of Acetaminophen Do not take more than  3,000 mg per day      Day 7    Time  Name of Medication Number of pills taken  Amount of Acetaminophen  Pain Level   Comments  AM PM       AM PM       AM PM       AM PM       AM PM       AM PM       AM PM       AM PM       Total Daily amount of Acetaminophen Do not take more than  3,000 mg per day        For additional information about how and where to safely dispose of unused opioid medications - https://www.morepowerfulnc.org  Disclaimer: This document contains information and/or instructional materials adapted from [facility]  for the typical patient with your condition. It does not replace medical advice from your health care provider because your experience may differ from that of the typical patient. Talk to your health care provider if you have any questions about this document, your condition or your treatment plan. Adapted from [facility]  

## 2020-09-02 NOTE — Anesthesia Postprocedure Evaluation (Signed)
Anesthesia Post Note  Patient: Dylan Byrd  Procedure(s) Performed: APPENDECTOMY LAPAROSCOPIC (N/A Abdomen)     Patient location during evaluation: PACU Anesthesia Type: General Level of consciousness: awake and alert, oriented and patient cooperative Pain management: pain level controlled Vital Signs Assessment: post-procedure vital signs reviewed and stable Respiratory status: spontaneous breathing, nonlabored ventilation and respiratory function stable Cardiovascular status: blood pressure returned to baseline and stable Postop Assessment: no apparent nausea or vomiting Anesthetic complications: no   No complications documented.  Last Vitals:  Vitals:   09/02/20 1145 09/02/20 1358  BP: 120/76 119/88  Pulse: 63 77  Resp: 17   Temp:  36.8 C  SpO2: 95% 97%    Last Pain:  Vitals:   09/02/20 1358  TempSrc: Oral  PainSc:                  Lannie Fields

## 2020-09-02 NOTE — Discharge Summary (Signed)
[facility] Discharge Summary   Patient ID: Dylan Byrd MRN: [id number] DOB/AGE: 1990-11-03 29 y.o.  Admit date: 09/01/2020 Discharge date: 09/02/2020  Admitting Diagnosis: Acute appendicitis  Discharge Diagnosis Patient Active Problem List   Diagnosis Date Noted  . Acute appendicitis 09/01/2020    Consultants None   Imaging: CT Abdomen Pelvis W Contrast  Result Date: 09/01/2020 CLINICAL DATA:  Right lower quadrant abdominal pain EXAM: CT ABDOMEN AND PELVIS WITH CONTRAST TECHNIQUE: Multidetector CT imaging of the abdomen and pelvis was performed using the standard protocol following bolus administration of intravenous contrast. CONTRAST:  OMNIPAQUE IOHEXOL 300 MG/ML  SOLN COMPARISON:  None. FINDINGS: Lower chest: The visualized heart size within normal limits. No pericardial fluid/thickening. No hiatal hernia. The visualized portions of the lungs are clear. Hepatobiliary: The liver is normal in density without focal abnormality.The main portal vein is patent. No evidence of calcified gallstones, gallbladder wall thickening or biliary dilatation. Pancreas: Unremarkable. No pancreatic ductal dilatation or surrounding inflammatory changes. Spleen: Normal in size without focal abnormality. Adrenals/Urinary Tract: Both adrenal glands appear normal. The kidneys and collecting system appear normal without evidence of urinary tract calculus or hydronephrosis. Bladder is unremarkable. Stomach/Bowel: The stomach, small bowel, and colon are normal in appearance. No inflammatory changes, wall thickening, or obstructive findings.The appendix is and large with wall thickening measuring up to 8 mm in transverse dimension. Surrounding fat stranding changes are seen. Scattered small lymph nodes are seen within the right lower quadrant. No loculated fluid collection or free air. Vascular/Lymphatic: There are no enlarged mesenteric, retroperitoneal, or pelvic lymph nodes. No significant  vascular findings are present. Reproductive: Other: No evidence of abdominal wall mass or hernia. Musculoskeletal: No acute or significant osseous findings. IMPRESSION: Findings consistent with acute uncomplicated appendicitis. No surrounding loculated fluid collections or free air. Electronically Signed   By: Jonna Clark M.D.   On: 09/01/2020 21:32    Procedures Dr. Derrell Lolling (09/02/20) - Laparoscopic Appendectomy  Hospital Course:  Patient is a 29 year old male who presented to [facility] with abdominal pain.  Workup showed acute appendicitis.  Patient was admitted and underwent procedure listed above.  Tolerated procedure well and was transferred to the floor.  Diet was advanced as tolerated.  On POD#0, the patient was voiding well, tolerating diet, ambulating well, pain well controlled, vital signs stable, incisions c/d/i and felt stable for discharge home.  Patient will follow up in our office in 3-4 weeks and knows to call with questions or concerns.  He will call to confirm appointment date/time.    I or a member of my team have reviewed this patient in the Controlled Substance Database.   Allergies as of 09/02/2020   No Known Allergies     Medication List    TAKE these medications   acetaminophen 500 MG tablet Commonly known as: TYLENOL Take 1 tablet (500 mg total) by mouth every 6 (six) hours as needed for mild pain or fever.   HYDROcodone-acetaminophen 5-325 MG tablet Commonly known as: NORCO/VICODIN Take 1 tablet by mouth every 6 (six) hours as needed for moderate pain.   ibuprofen 200 MG tablet Commonly known as: ADVIL Take 200 mg by mouth every 6 (six) hours as needed for headache or moderate pain.         Follow-up Information    [facility]. Go on 09/24/2020.   Specialty: General Surgery Why: Follow up appointment scheduled for 1:45 PM. Please arrive 30 min prior to appointment time. Bring photo ID  and insurance information.  Contact information: [address] [place] [address] [phone]               Signed: Juliet Rude , [facility] 09/02/2020, 2:48 PM Please see Amion for pager number during day hours 7:00am-4:30pm

## 2020-09-02 NOTE — Anesthesia Procedure Notes (Signed)
Procedure Name: Intubation Date/Time: 09/02/2020 10:12 AM Performed by: Janene Harvey, CRNA Pre-anesthesia Checklist: Patient identified, Emergency Drugs available, Suction available and Patient being monitored Patient Re-evaluated:Patient Re-evaluated prior to induction Oxygen Delivery Method: Circle system utilized Preoxygenation: Pre-oxygenation with 100% oxygen Induction Type: IV induction Ventilation: Mask ventilation without difficulty Laryngoscope Size: Mac and 4 Grade View: Grade II Tube type: Oral Tube size: 7.5 mm Number of attempts: 1 Airway Equipment and Method: Stylet and Oral airway Placement Confirmation: ETT inserted through vocal cords under direct vision,  positive ETCO2 and breath sounds checked- equal and bilateral Secured at: 22 cm Tube secured with: Tape Dental Injury: Teeth and Oropharynx as per pre-operative assessment

## 2020-09-02 NOTE — Op Note (Signed)
09/02/2020  10:52 AM  PATIENT:  Dylan Byrd  29 y.o. male  PRE-OPERATIVE DIAGNOSIS:  acute appendicitis  POST-OPERATIVE DIAGNOSIS:  acute appendicitis  PROCEDURE:  Procedure(s): APPENDECTOMY LAPAROSCOPIC (N/A)  SURGEON:  Surgeon(s) and Role:    Axel Filler, MD - Primary Dr. Barton Fanny, PGY7- who was essential and assisting with retraction and removal of the appendix.  ANESTHESIA:   local and general  EBL:  5 mL   BLOOD ADMINISTERED:none  DRAINS: none   LOCAL MEDICATIONS USED:  BUPIVICAINE   SPECIMEN:  Source of Specimen:  appendix  DISPOSITION OF SPECIMEN:  PATHOLOGY  COUNTS:  YES  TOURNIQUET:  * No tourniquets in log *  DICTATION: .Dragon Dictation Complications: none  Counts: reported as correct x 2  Findings:  The patient had a acutely inflamed appendix, non perforated  Specimen: Appendix  Indications for procedure:  The patient is a 29 year old male with a history of periumbilical pain localized in the right lower quadrant patient had a CT scan which revealed signs consistent with acute appendicitis the patient back in for laparoscopic appendectomy.  Details of the procedure:The patient was taken back to the operating room. The patient was placed in supine position with bilateral SCDs in place.  A foley catheter was place. The patient was prepped and draped in the usual sterile fashion.  After appropriate anitbiotics were confirmed, a time-out was confirmed and all facts were verified.    A pneumoperitoneum of 14 mmHg was obtained via a Veress needle technique in the left lower quadrant quadrant.  A 5 mm trocar and 5 mm camera then placed intra-abdominally there is no injury to any intra-abdominal organs a 10 mm infraumbilical port was placed and direct visualization as was a 5 mm port in the suprapubic area.   The appendix was identified and seen to be non-perforated.  The appendix was cleaned down to the appendiceal base. The mesoappendix was then incised  and the appendiceal artery was cauterized.  The the appendiceal base was clean.  A gold hemoclip was placed proximallyx2 and one distally and the appendix was transected between these 2. A retrieval bag was then placed into the abdomen and the specimen placed in the bag. The appendiceal stump was cauterized. We evacuate the fluid from the pelvis until the effluent was clear.  The appendix and retrieval  bag was then retrieved via the supraumbilical port. #1 Vicryl was used to reapproximate the fascia at the umbilical port site x1. The skin was reapproximated all port sites 3-0 Monocryl subcuticular fashion. The skin was dressed with Dermabond.  The patient had the foley removed. The patient was awakened from general anesthesia was taken to recovery room in stable condition.    I was present for the critical and key portions of the surgery and I was immediately available throughout the entire procedure.  I have reviewed and agree with the operative note as documented by the resident.   PLAN OF CARE: Admit to inpatient   PATIENT DISPOSITION:  PACU - hemodynamically stable.   Delay start of Pharmacological VTE agent (>24hrs) due to surgical blood loss or risk of bleeding: not applicable

## 2020-09-02 NOTE — ED Notes (Signed)
RN attempted to call report , floor RN in room will call back

## 2020-09-03 ENCOUNTER — Encounter (HOSPITAL_COMMUNITY): Payer: Self-pay | Admitting: General Surgery

## 2020-09-03 LAB — SURGICAL PATHOLOGY

## 2020-09-24 ENCOUNTER — Other Ambulatory Visit: Payer: Self-pay

## 2020-09-24 ENCOUNTER — Ambulatory Visit (HOSPITAL_COMMUNITY)
Admission: EM | Admit: 2020-09-24 | Discharge: 2020-09-24 | Disposition: A | Discharge status: Home / Self Care | Payer: 59 | Attending: Family Medicine | Admitting: Family Medicine

## 2020-10-30 ENCOUNTER — Ambulatory Visit (HOSPITAL_COMMUNITY)
Admission: EM | Admit: 2020-10-30 | Discharge: 2020-10-30 | Disposition: A | Discharge status: Home / Self Care | Payer: 59 | Attending: Emergency Medicine | Admitting: Emergency Medicine

## 2020-10-30 ENCOUNTER — Encounter (HOSPITAL_COMMUNITY): Payer: Self-pay | Admitting: *Deleted

## 2020-10-30 ENCOUNTER — Other Ambulatory Visit: Payer: Self-pay

## 2020-10-30 DIAGNOSIS — R1032 Left lower quadrant pain: Secondary | ICD-10-CM | POA: Diagnosis not present

## 2020-10-30 DIAGNOSIS — N50812 Left testicular pain: Secondary | ICD-10-CM

## 2020-10-30 DIAGNOSIS — R1084 Generalized abdominal pain: Secondary | ICD-10-CM | POA: Diagnosis not present

## 2020-10-30 LAB — POCT URINALYSIS DIPSTICK, ED / UC
Bilirubin Urine: NEGATIVE
Glucose, UA: NEGATIVE mg/dL
Hgb urine dipstick: NEGATIVE
Ketones, ur: NEGATIVE mg/dL
Leukocytes,Ua: NEGATIVE
Nitrite: NEGATIVE
Protein, ur: NEGATIVE mg/dL
Specific Gravity, Urine: 1.015 (ref 1.005–1.030)
Urobilinogen, UA: 0.2 mg/dL (ref 0.0–1.0)
pH: 7 (ref 5.0–8.0)

## 2020-10-30 NOTE — ED Triage Notes (Signed)
Pt reports Lt sided ABD pain  And decreased appetite . Pt reports he had a appendectony 3 months.

## 2020-10-30 NOTE — Discharge Instructions (Addendum)
Call to schedule the ultrasound [phone], option 3.  Go to the Emergency Department right away if you have acute worsening symptoms.

## 2020-10-30 NOTE — ED Provider Notes (Signed)
[facility]    CSN: [id number] Arrival date & time: 10/30/20  1709      History   Chief Complaint Chief Complaint  Patient presents with  . Abdominal Pain    Decreased appetite     HPI Dylan Byrd is a 30 y.o. male.   Patient presents with 2-week history of left lower quadrant abdominal pain and decreased appetite.  He also reports 86-month history of left testicular discomfort and believes he may feel a small abnormality.  He states this was checked 3 months ago when he was in the hospital for his appendectomy but states he was told nothing was wrong.  He denies fever, dysuria, vomiting, diarrhea, constipation, penile discharge, rash, lesions, or other symptoms.  Last bowel movement today.  His medical history includes appendectomy in November 2021.  He states he has been unable to establish a PCP since moving here.  The history is provided by the patient and medical records.    History reviewed. No pertinent past medical history.  Patient Active Problem List   Diagnosis Date Noted  . Acute appendicitis 09/01/2020    Past Surgical History:  Procedure Laterality Date  . APPENDECTOMY    . LAPAROSCOPIC APPENDECTOMY N/A 09/02/2020   Procedure: APPENDECTOMY LAPAROSCOPIC;  Surgeon: Axel Filler, MD;  Location: [facility] OR;  Service: General;  Laterality: N/A;       Home Medications    Prior to Admission medications   Medication Sig Start Date End Date Taking? Authorizing Provider  acetaminophen (TYLENOL) 500 MG tablet Take 1 tablet (500 mg total) by mouth every 6 (six) hours as needed for mild pain or fever. 09/02/20  Yes Trixie Deis R, PA-C  ibuprofen (ADVIL) 200 MG tablet Take 200 mg by mouth every 6 (six) hours as needed for headache or moderate pain.   Yes [provider]  HYDROcodone-acetaminophen (NORCO/VICODIN) 5-325 MG tablet Take 1 tablet by mouth every 6 (six) hours as needed for moderate pain. 09/02/20   Juliet Rude, PA-C    Family  History History reviewed. No pertinent family history.  Social History Social History   Tobacco Use  . Smoking status: Never Smoker  . Smokeless tobacco: Never Used     Allergies   Patient has no known allergies.   Review of Systems Review of Systems  Constitutional: Negative for chills and fever.  HENT: Negative for ear pain and sore throat.   Eyes: Negative for pain and visual disturbance.  Respiratory: Negative for cough and shortness of breath.   Cardiovascular: Negative for chest pain and palpitations.  Gastrointestinal: Positive for abdominal pain. Negative for constipation, diarrhea, nausea and vomiting.  Genitourinary: Positive for testicular pain. Negative for dysuria, flank pain, hematuria, penile discharge and scrotal swelling.  Musculoskeletal: Negative for arthralgias and back pain.  Skin: Negative for color change and rash.  Neurological: Negative for seizures and syncope.  All other systems reviewed and are negative.    Physical Exam Triage Vital Signs ED Triage Vitals  Enc Vitals Group     BP 10/30/20 1752 (!) 143/92     Pulse Rate 10/30/20 1752 97     Resp 10/30/20 1752 16     Temp 10/30/20 1752 97.8 F (36.6 C)     Temp Source 10/30/20 1752 Oral     SpO2 10/30/20 1752 99 %     Weight --      Height --      Head Circumference --      Peak Flow --  Pain Score 10/30/20 1754 2     Pain Loc --      Pain Edu? --      Excl. in GC? --    No data found.  Updated Vital Signs BP (!) 143/92 (BP Location: Right Arm)   Pulse 97   Temp 97.8 F (36.6 C) (Oral)   Resp 16   SpO2 99%   Visual Acuity Right Eye Distance:   Left Eye Distance:   Bilateral Distance:    Right Eye Near:   Left Eye Near:    Bilateral Near:     Physical Exam Vitals and nursing note reviewed.  Constitutional:      General: He is not in acute distress.    Appearance: He is well-developed and well-nourished. He is not ill-appearing.  HENT:     Head: Normocephalic  and atraumatic.     Mouth/Throat:     Mouth: Mucous membranes are moist.  Eyes:     Conjunctiva/sclera: Conjunctivae normal.  Cardiovascular:     Rate and Rhythm: Normal rate and regular rhythm.     Heart sounds: No murmur heard.   Pulmonary:     Effort: Pulmonary effort is normal. No respiratory distress.     Breath sounds: Normal breath sounds.  Abdominal:     General: Bowel sounds are normal.     Palpations: Abdomen is soft.     Tenderness: There is no abdominal tenderness. There is no right CVA tenderness, left CVA tenderness, guarding or rebound.  Genitourinary:    Penis: Normal.      Testes: Normal.     Comments: Testicles nontender; no mass noted.  Musculoskeletal:        General: No edema.     Cervical back: Neck supple.  Skin:    General: Skin is warm and dry.  Neurological:     General: No focal deficit present.     Mental Status: He is alert and oriented to person, place, and time.  Psychiatric:        Mood and Affect: Mood and affect and mood normal.        Behavior: Behavior normal.      UC Treatments / Results  Labs (all labs ordered are listed, but only abnormal results are displayed) Labs Reviewed  POCT URINALYSIS DIPSTICK, ED / UC    EKG   Radiology No results found.  Procedures Procedures (including critical care time)  Medications Ordered in UC Medications - No data to display  Initial Impression / Assessment and Plan / UC Course  I have reviewed the triage vital signs and the nursing notes.  Pertinent labs & imaging results that were available during my care of the patient were reviewed by me and considered in my medical decision making (see chart for details).    Left testicular discomfort, left lower quadrant abdominal pain.  Urine negative.  Outpatient scrotal ultrasound ordered.  Strict instructions to go to the ED for acute worsening symptoms, including testicular pain or abdominal pain.  Instructed patient to establish a PCP.   Instructed patient to follow-up with urology if his testicular symptoms are not improving.  Patient agrees to plan of care.   Final Clinical Impressions(s) / UC Diagnoses   Final diagnoses:  Pain in left testicle  Left lower quadrant abdominal pain     Discharge Instructions     Call to schedule the ultrasound [phone], option 3.  Go to the Emergency Department right away if you have acute worsening symptoms.  ED Prescriptions    None     PDMP not reviewed this encounter.   Mickie Bail, NP 10/31/20 1001

## 2020-10-31 ENCOUNTER — Telehealth (HOSPITAL_COMMUNITY): Payer: Self-pay | Admitting: Emergency Medicine

## 2020-10-31 NOTE — Telephone Encounter (Signed)
Spoke to patient via telephone.  He is doing well.  He called ultrasound this morning and was told they will call him on Monday to schedule an appointment.  He also plans to call urology on Monday to schedule an appointment.  ED precautions discussed again.

## 2020-11-03 ENCOUNTER — Ambulatory Visit (HOSPITAL_COMMUNITY)
Admission: RE | Admit: 2020-11-03 | Discharge: 2020-11-03 | Disposition: A | Discharge status: Home / Self Care | Payer: 59 | Source: Ambulatory Visit | Attending: Emergency Medicine | Admitting: Emergency Medicine

## 2020-11-03 ENCOUNTER — Other Ambulatory Visit: Payer: Self-pay

## 2020-11-03 DIAGNOSIS — N433 Hydrocele, unspecified: Secondary | ICD-10-CM | POA: Insufficient documentation

## 2020-11-03 DIAGNOSIS — N50812 Left testicular pain: Secondary | ICD-10-CM | POA: Insufficient documentation

## 2020-11-03 DIAGNOSIS — I861 Scrotal varices: Secondary | ICD-10-CM | POA: Diagnosis not present

## 2020-11-04 ENCOUNTER — Telehealth (HOSPITAL_COMMUNITY): Payer: Self-pay | Admitting: Emergency Medicine

## 2020-11-04 NOTE — Telephone Encounter (Signed)
Discussed ultrasound results with patient via telephone.  Discussed that he will call urology to schedule appointment.

## 2020-11-12 NOTE — Progress Notes (Signed)
   Subjective:    Patient ID: Dylan Byrd, male    DOB: [birth year], 30 y.o.   MRN: [id number]  CC: Establish care  HPI:  Dylan Byrd is a very pleasant 30 y.o. male who presents today to establish care.  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  Current medications: None  Past surgical history Appendectomy 09/02/2020  Family history Father: HTN Paternal grandfather: HTN, MI Mom and siblings are healthy without known medical problems  Social history Education/Occupation: Scientist, research (physical sciences), currently works for [employer] Sexual hx: not currently sexually active, male partners, uses condoms for contraception Alc/tobacco/drugs: No alcohol, former smoker (1 PPD for 7 years, quit in 2019), uses marijuana daily Exercise: None Hobbies: Gaming  ROS: pertinent noted in the HPI   Objective:  BP 122/80   Pulse 93   Ht 5\' 10"  (1.778 m)   Wt 184 lb 12.8 oz (83.8 kg)   SpO2 99%   BMI 26.52 kg/m   Vitals and nursing note reviewed  General: NAD, pleasant, able to participate in exam Cardiac: RRR, S1 S2 present. normal heart sounds, no murmurs. Respiratory: CTAB, normal effort, No wheezes, rales or rhonchi Abdomen: Bowel sounds present, non-tender, non-distended, no hepatosplenomegaly Extremities: no edema or cyanosis. Skin: warm and dry, no rashes noted Neuro: alert, no obvious focal deficits Psych: Normal affect and mood   Assessment & Plan:   Hyperhidrosis Chronic for many years, well controlled with Drysol. Affects underarms only-- no significant sweating of palms and soles -Refills provided for Drysol  Health Maintenance COVID booster and flu vaccine administered at today's visit Patient counseled on importance of maintaining healthy BMI and  obtaining 150 minutes of exercise weekly  No indication for screening labs at this time  Follow up in 1 year or sooner as needed   , MD [facility] PGY-1

## 2020-11-13 ENCOUNTER — Ambulatory Visit (INDEPENDENT_AMBULATORY_CARE_PROVIDER_SITE_OTHER): Payer: 59 | Admitting: Family Medicine

## 2020-11-13 ENCOUNTER — Other Ambulatory Visit: Payer: Self-pay

## 2020-11-13 ENCOUNTER — Encounter: Payer: Self-pay | Admitting: Family Medicine

## 2020-11-13 VITALS — BP 122/80 | HR 93 | Ht 70.0 in | Wt 184.8 lb

## 2020-11-13 DIAGNOSIS — R61 Generalized hyperhidrosis: Secondary | ICD-10-CM | POA: Diagnosis not present

## 2020-11-13 DIAGNOSIS — Z0000 Encounter for general adult medical examination without abnormal findings: Secondary | ICD-10-CM | POA: Diagnosis not present

## 2020-11-13 DIAGNOSIS — Z23 Encounter for immunization: Secondary | ICD-10-CM

## 2020-11-13 DIAGNOSIS — N433 Hydrocele, unspecified: Secondary | ICD-10-CM | POA: Insufficient documentation

## 2020-11-13 MED ORDER — DRYSOL 20 % EX SOLN: Freq: Every day | CUTANEOUS | 3 refills | Status: AC

## 2020-11-13 NOTE — Patient Instructions (Addendum)
It was a pleasure meeting you. Thanks for choosing [facility] for your primary care.  As discussed, in order to improve your overall health, quality of life, and longevity: 1. I recommend trying to get 150 minutes of exercise per week (that's a 30 minute walk, 5x per week) 2. Continue to avoid tobacco products and alcohol. Consider cutting back on marijuana consumption  Return in 1 year or sooner as needed.  Dr. [facility]

## 2020-11-13 NOTE — Assessment & Plan Note (Signed)
Chronic for many years, well controlled with Drysol. Affects underarms only-- no significant sweating of palms and soles -Refills provided for Drysol

## 2021-02-11 ENCOUNTER — Other Ambulatory Visit: Payer: Self-pay

## 2021-02-11 ENCOUNTER — Ambulatory Visit (INDEPENDENT_AMBULATORY_CARE_PROVIDER_SITE_OTHER): Payer: 59 | Admitting: Family Medicine

## 2021-02-11 VITALS — Temp 99.1°F | Ht 70.0 in | Wt 180.6 lb

## 2021-02-11 DIAGNOSIS — J029 Acute pharyngitis, unspecified: Secondary | ICD-10-CM

## 2021-02-11 DIAGNOSIS — J069 Acute upper respiratory infection, unspecified: Secondary | ICD-10-CM

## 2021-02-11 LAB — POCT RAPID STREP A (OFFICE): Rapid Strep A Screen: NEGATIVE

## 2021-02-11 NOTE — Assessment & Plan Note (Signed)
Most likely viral etiology.  COVID is possible although would be unusual to see a COVID infection only 2 weeks out from his last booster.  Negative rapid strep test in clinic today.  I do not think that we need to send out a culture for the strep test, his physical exam is not particularly concerning for strep throat.  He was informed that he was safe and appropriate to continue getting better at home and to use Tylenol, Motrin and lots of fluids.  He was told that if he does not feel significantly improved by Monday, he should call the clinic and we would consider ordering a chest x-ray. -Follow-up COVID test -Consider chest x-ray if no improvement by Monday

## 2021-02-11 NOTE — Patient Instructions (Signed)
I am sorry to hear that you have been feeling pretty crummy.  Given your symptoms, I think this is most likely a viral infection, possibly COVID.  We will send out the COVID test and hopefully have an answer by tomorrow or the next day.  I will call you with the results.  As long as you do not not experience significant shortness of breath or worsening chest pain, you are okay to continue getting better at home.  Tylenol and fluids are your friends.  If you still feel significant symptoms on Monday, please call the clinic and may be helpful to move forward with a chest x-ray.

## 2021-02-11 NOTE — Progress Notes (Signed)
    SUBJECTIVE:   CHIEF COMPLAINT / HPI:   URI symptoms Dylan Byrd reports that he has been experiencing symptoms since this past Sunday.  He notes significant fatigue, some body aches, sore throat and subjective fever.  He noted some chills and sweating at home although he never had a temperature over 100.0 when he measured at home.  Additionally, he feels some tightness/pressure in his chest bilaterally that seems to always be present.  He notices this more if he gets up and tries to walk around.  He denies significant shortness of breath, nausea, vomiting, changes in bowel movement, rashes.  He can smell and taste normally.  He has gotten a total of 3 COVID vaccines.  His booster was about 2 months ago.  PERTINENT  PMH / PSH: Multiple episodes of strep pharyngitis throughout his childhood and teenage years  OBJECTIVE:   Temp 99.1 F (37.3 C)   Ht 5\' 10"  (1.778 m)   Wt 180 lb 9.6 oz (81.9 kg)   SpO2 98%   BMI 25.91 kg/m    General: Alert and cooperative and appears to be in no acute distress Cardio: Normal S1 and S2, no S3 or S4. Rhythm is regular. No murmurs or rubs.   Pulm: Clear to auscultation bilaterally, no crackles, wheezing, or diminished breath sounds. Normal respiratory effort Abdomen: Bowel sounds normal. Abdomen soft and non-tender.  Extremities: No peripheral edema. Warm/ well perfused.  Strong radial pulses. Neuro: Cranial nerves grossly intact  Rapid strep: Negative  ASSESSMENT/PLAN:   Viral URI Most likely viral etiology.  COVID is possible although would be unusual to see a COVID infection only 2 weeks out from his last booster.  Negative rapid strep test in clinic today.  I do not think that we need to send out a culture for the strep test, his physical exam is not particularly concerning for strep throat.  He was informed that he was safe and appropriate to continue getting better at home and to use Tylenol, Motrin and lots of fluids.  He was told that if he  does not feel significantly improved by Monday, he should call the clinic and we would consider ordering a chest x-ray. -Follow-up COVID test -Consider chest x-ray if no improvement by Monday     Wednesday, MD [facility]

## 2021-02-13 LAB — SARS-COV-2, NAA 2 DAY TAT

## 2021-02-13 LAB — NOVEL CORONAVIRUS, NAA: SARS-CoV-2, NAA: NOT DETECTED

## 2021-02-15 ENCOUNTER — Other Ambulatory Visit: Payer: Self-pay | Admitting: Family Medicine

## 2021-02-15 ENCOUNTER — Telehealth: Payer: Self-pay

## 2021-02-15 DIAGNOSIS — J069 Acute upper respiratory infection, unspecified: Secondary | ICD-10-CM

## 2021-02-15 NOTE — Telephone Encounter (Signed)
Patient calls nurse line requesting results from last week. Patient advised of negative Covid and Strep tests. Patient reports he is feeling a little better, however is still experiencing some chest tightness. Patient would like to move forward with the chest xray. Will forward to provider who saw patient to place order. Please let me now when this has been done so I can inform the patient.

## 2021-02-15 NOTE — Progress Notes (Signed)
Patient feeling only slightly better after being previously seen for viral URI in the clinic.  Patient continues to have chest tightness.  The provider who saw this patient is currently out of the office, I am covering his inbox.  Contingency plan per the 02/11/2021 encounter was to have chest x-ray performed if patient was not feeling significantly better by today Monday 5/2.  Has patient is not feeling significantly better today, we will go ahead and order 2 view chest x-ray.  X-ray ordered for [facility].  We will also inform CMA who had spoken with the patient when he called the clinic.  I called the patient at the number provided in the chart [phone].  Patient did not answer, I left the patient a voicemail explaining to him that he could go to [facility] at [address] anytime tomorrow 02/16/2021 to get his x-ray which I ordered for him today.   Peggyann Shoals, DO [facility], PGY-3 02/15/2021 7:47 PM

## 2021-05-20 IMAGING — CT CT ABD-PELV W/ CM
2 of 4 series · 16 of 46 positions shown, 18 images · IV contrast (APPLIED)
Comparison: None.

CLINICAL DATA: Right lower quadrant abdominal pain

EXAM:
CT ABDOMEN AND PELVIS WITH CONTRAST
TECHNIQUE: Multidetector CT imaging of the abdomen and pelvis was performed
using the standard protocol following bolus administration of
intravenous contrast.
CONTRAST:  100mL OMNIPAQUE IOHEXOL 300 MG/ML  SOLN

[Series 3: abdomen 5.0 · axial · 0.88mm/px · z∈[-607,-182]mm · 13 of 95 slices shown, 15 images]
[im 5/95  soft-tissue]
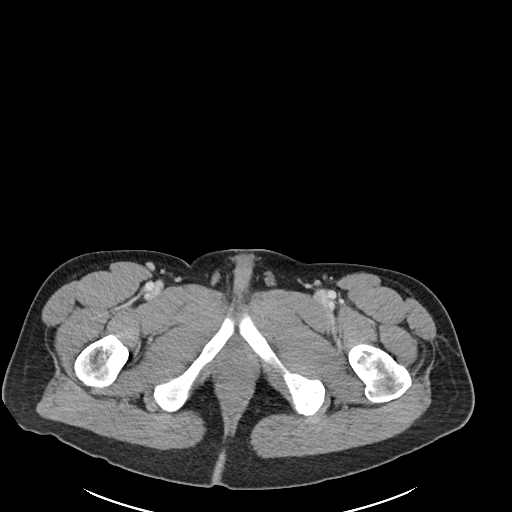
[im 5/95  bone]
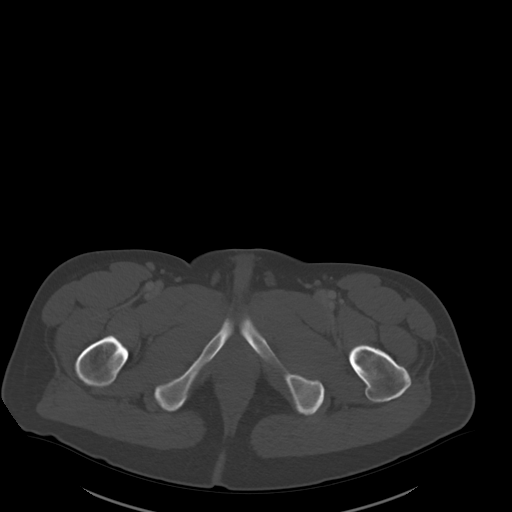
[im 15/95  soft-tissue]
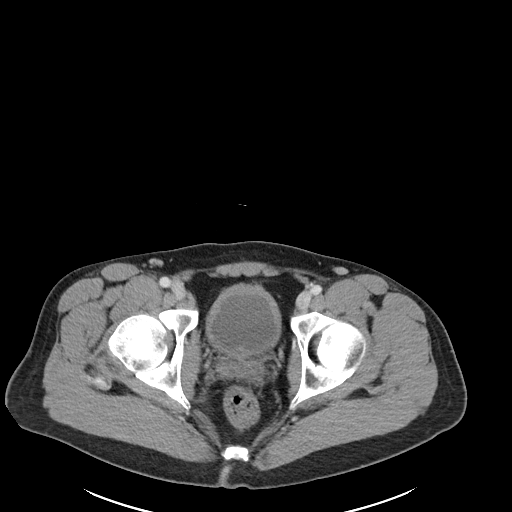
[im 20/95  soft-tissue]
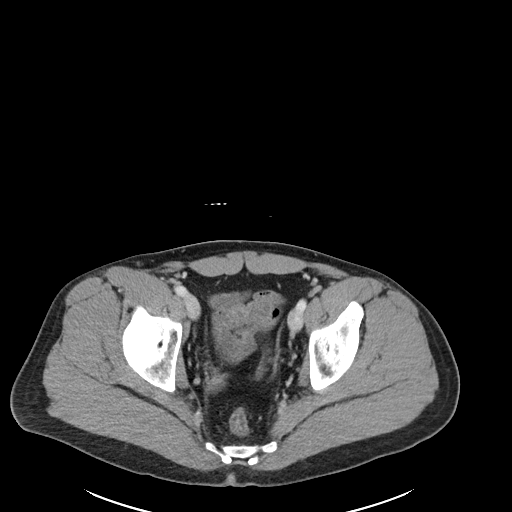
[im 25/95  soft-tissue]
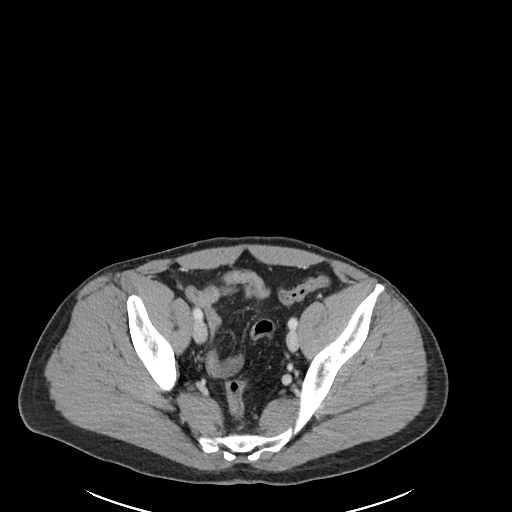
[im 35/95  soft-tissue]
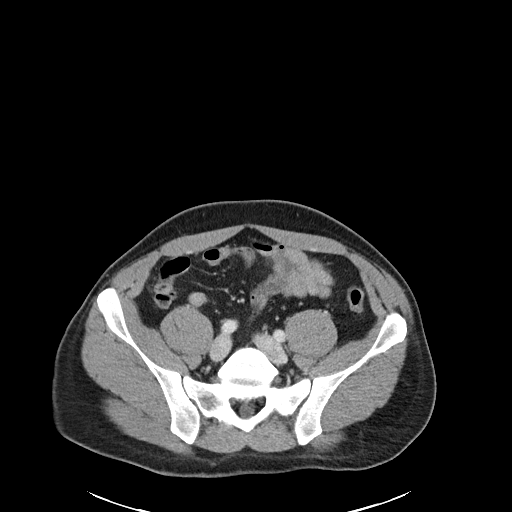
[im 40/95  soft-tissue]
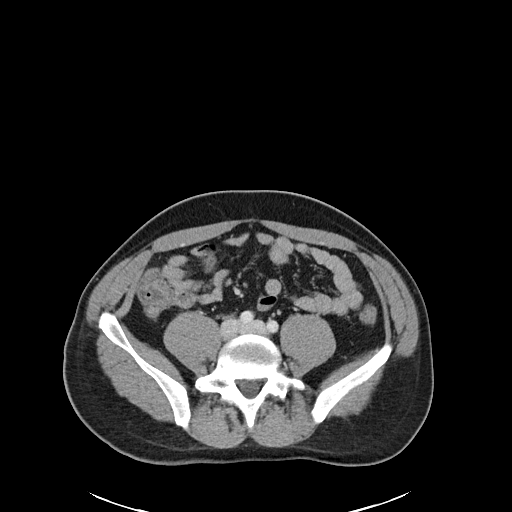
[im 50/95  soft-tissue]
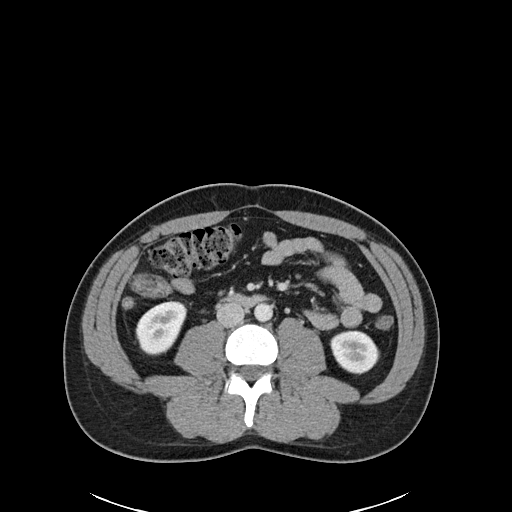
[im 55/95  soft-tissue]
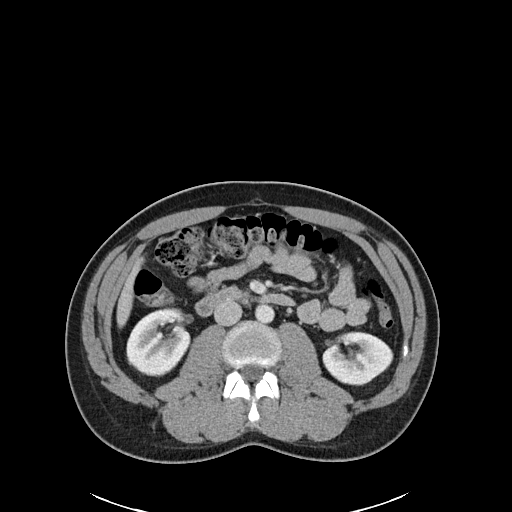
[im 60/95  soft-tissue]
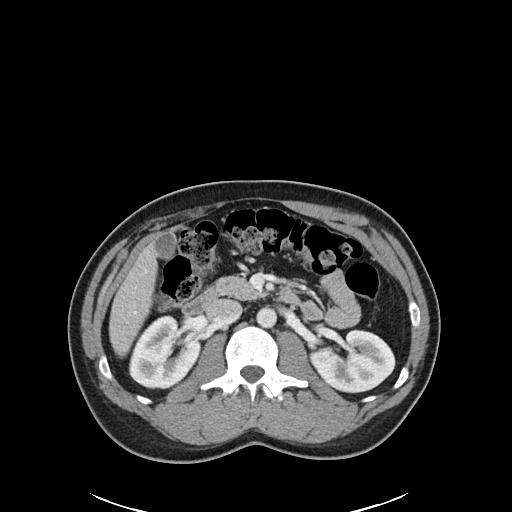
[im 60/95  bone]
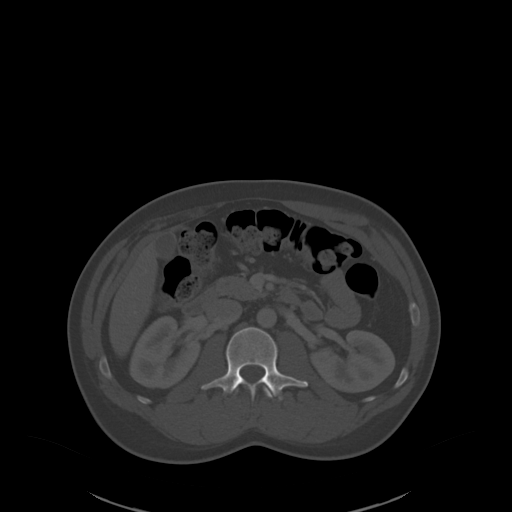
[im 70/95  soft-tissue]
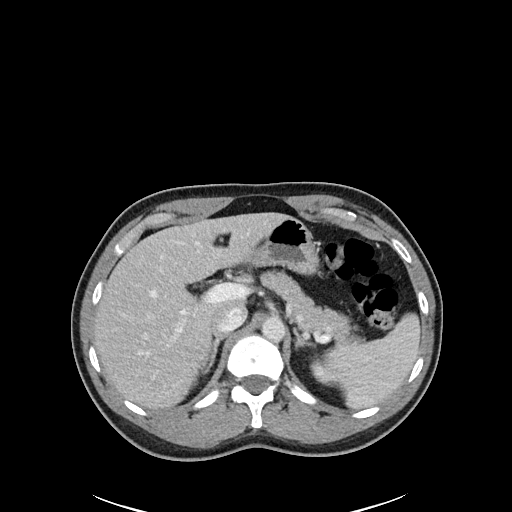
[im 75/95  soft-tissue]
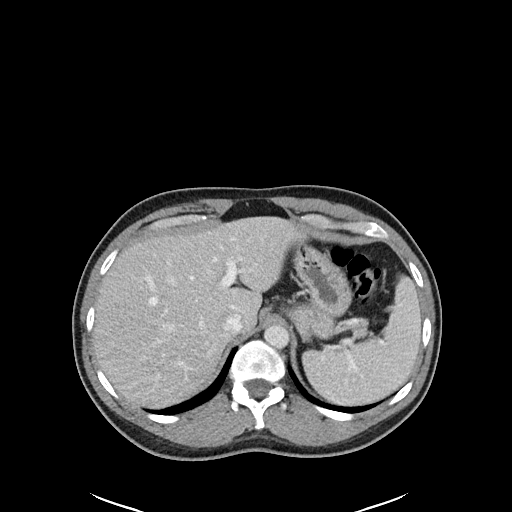
[im 80/95  soft-tissue]
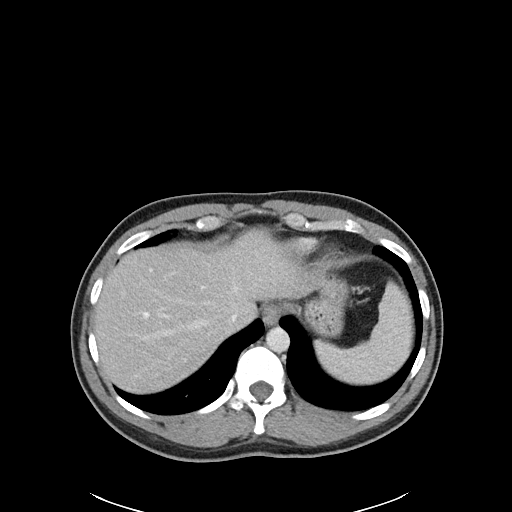
[im 90/95  soft-tissue]
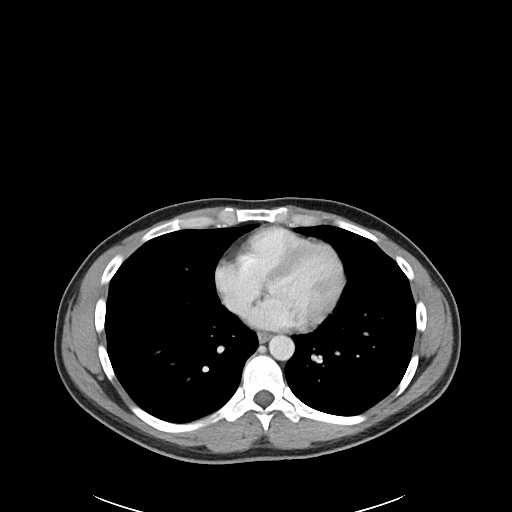

[Series 6: abdomen 3.0 mpr cor · coronal · 0.77mm/px · 3 of 91 slices shown]
[im 31/91  soft-tissue]
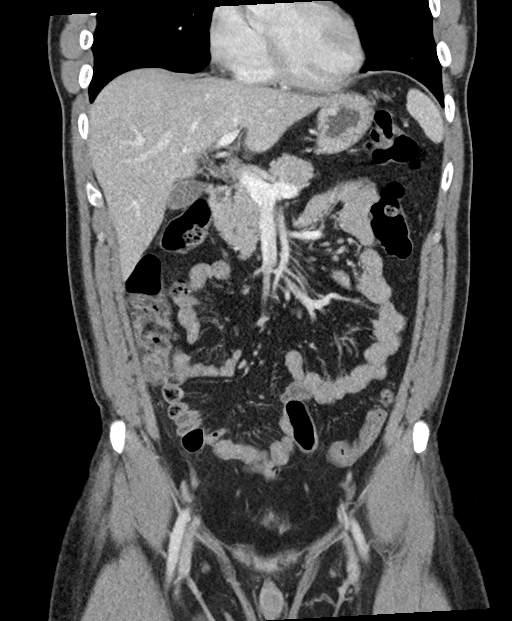
[im 41/91  soft-tissue]
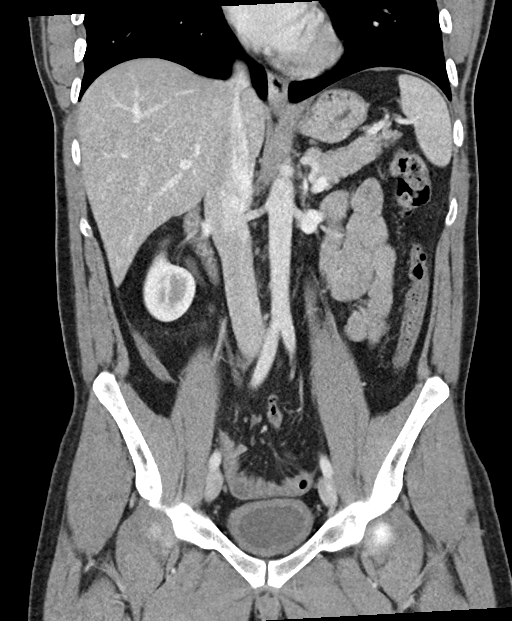
[im 51/91  soft-tissue]
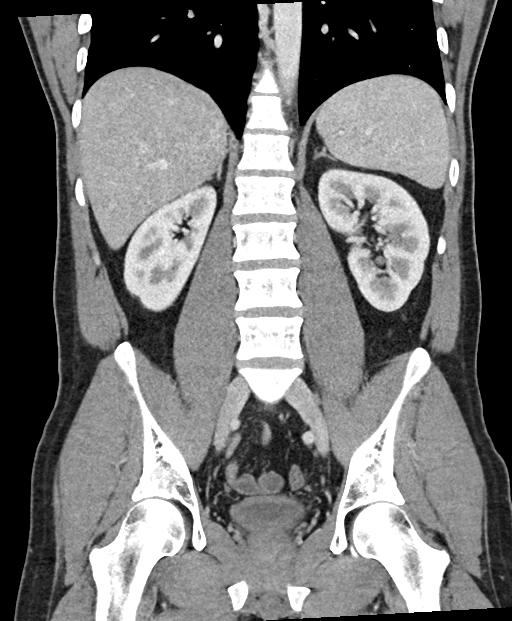

[16 of 46 positions shown; findings below may reference images not displayed]

FINDINGS: Lower chest: The visualized heart size within normal limits. No
pericardial fluid/thickening.

No hiatal hernia.

The visualized portions of the lungs are clear.

Hepatobiliary: The liver is normal in density without focal
abnormality.The main portal vein is patent. No evidence of calcified
gallstones, gallbladder wall thickening or biliary dilatation.

Pancreas: Unremarkable. No pancreatic ductal dilatation or
surrounding inflammatory changes.

Spleen: Normal in size without focal abnormality.

Adrenals/Urinary Tract: Both adrenal glands appear normal. The
kidneys and collecting system appear normal without evidence of
urinary tract calculus or hydronephrosis. Bladder is unremarkable.

Stomach/Bowel: The stomach, small bowel, and colon are normal in
appearance. No inflammatory changes, wall thickening, or obstructive
findings.The appendix is and large with wall thickening measuring up
to 8 mm in transverse dimension. Surrounding fat stranding changes
are seen. Scattered small lymph nodes are seen within the right
lower quadrant. No loculated fluid collection or free air.

Vascular/Lymphatic: There are no enlarged mesenteric,
retroperitoneal, or pelvic lymph nodes. No significant vascular
findings are present.

Reproductive:

Other: No evidence of abdominal wall mass or hernia.

Musculoskeletal: No acute or significant osseous findings.
IMPRESSION: Findings consistent with acute uncomplicated appendicitis. No
surrounding loculated fluid collections or free air.

## 2021-07-22 IMAGING — US US SCROTUM W/ DOPPLER COMPLETE
1 series · 14 of 25 positions shown · non-contrast
Comparison: No prior.

CLINICAL DATA: Left testicular pain.

EXAM:
SCROTAL ULTRASOUND
DOPPLER ULTRASOUND OF THE TESTICLES
TECHNIQUE: Complete ultrasound examination of the testicles, epididymis, and
other scrotal structures was performed. Color and spectral Doppler
ultrasound were also utilized to evaluate blood flow to the
testicles.

[Series 1: us scrotum w/doppler · 14 of 64 slices shown]
[im 1/64]
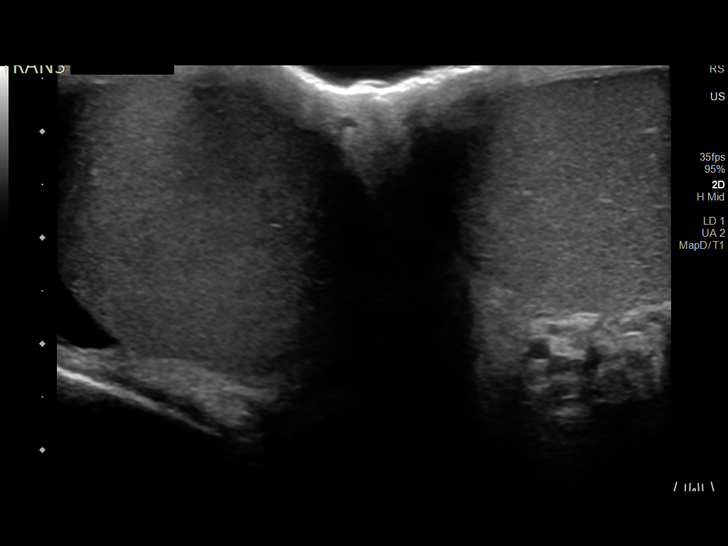
[im 6/64]
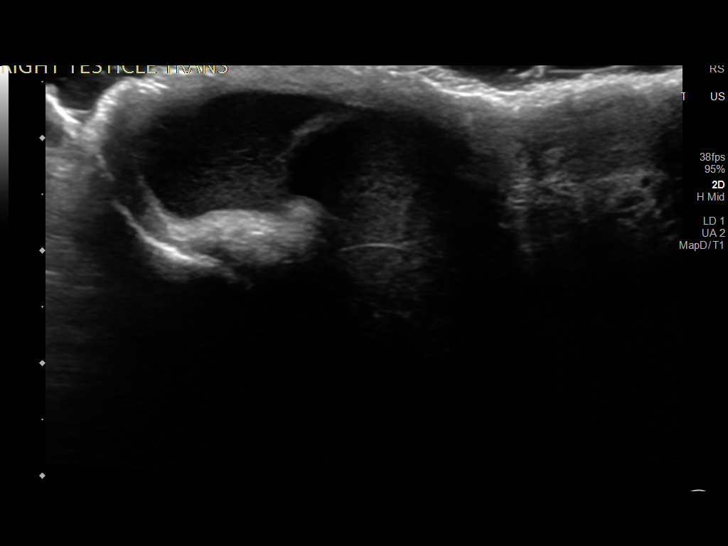
[im 11/64]
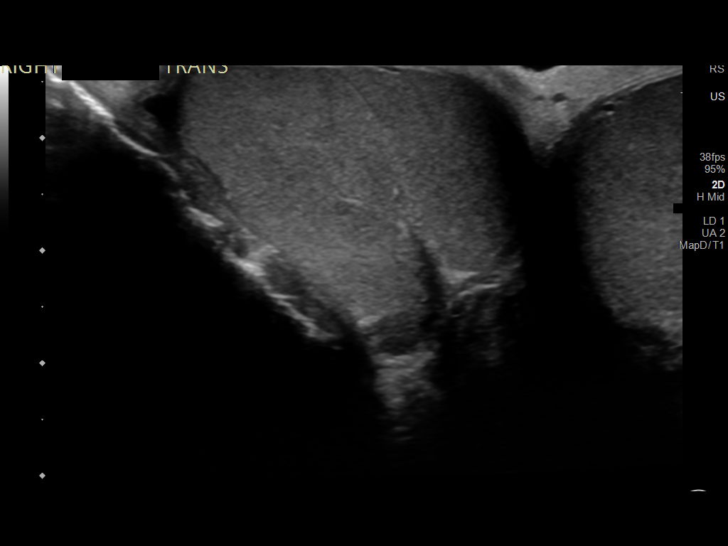
[im 16/64]
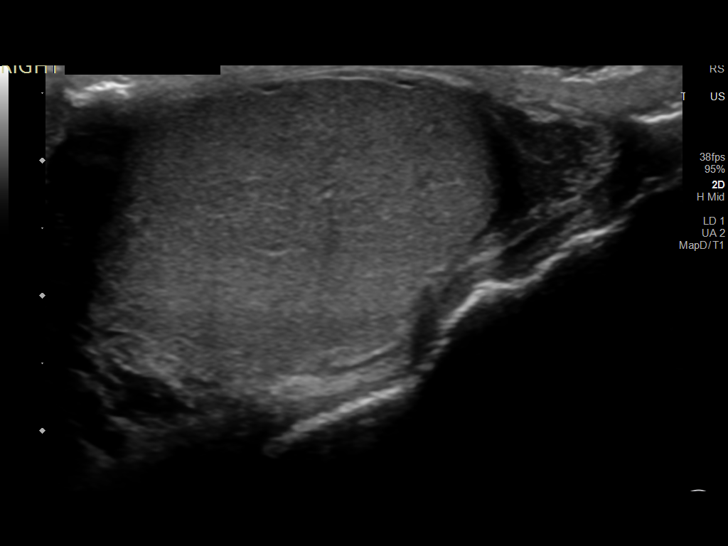
[im 22/64]
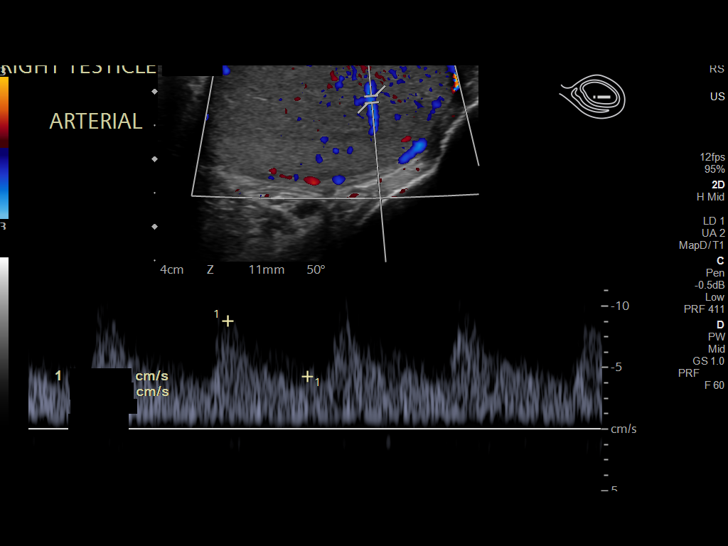
[im 24/64]
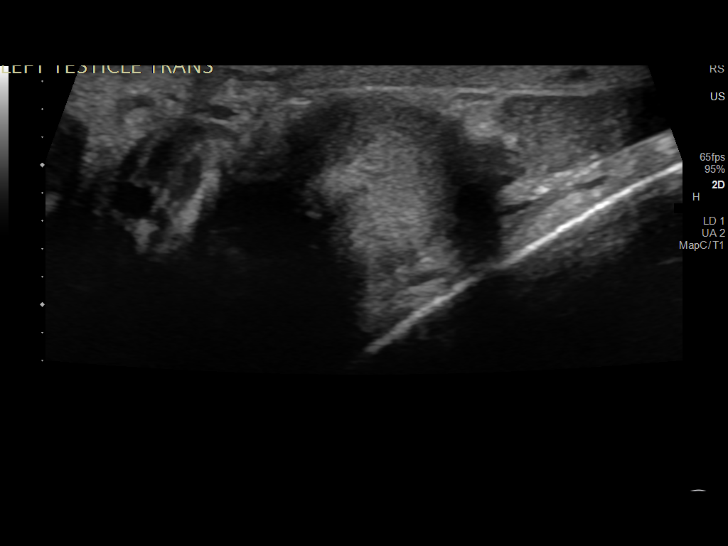
[im 29/64]
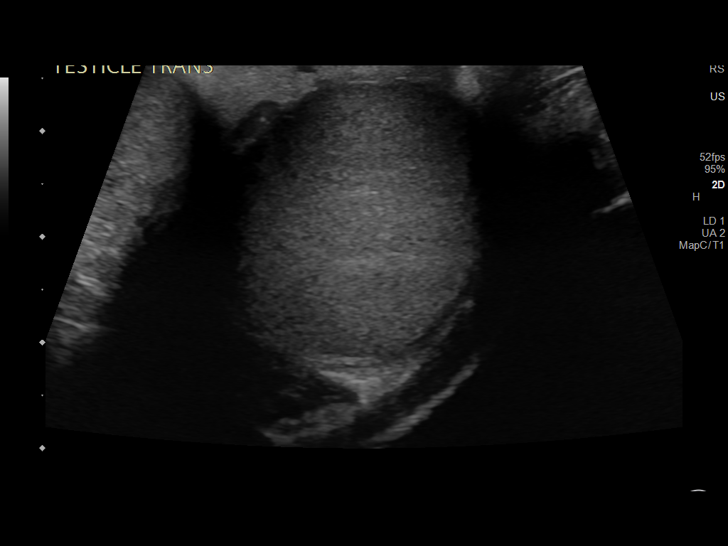
[im 35/64]
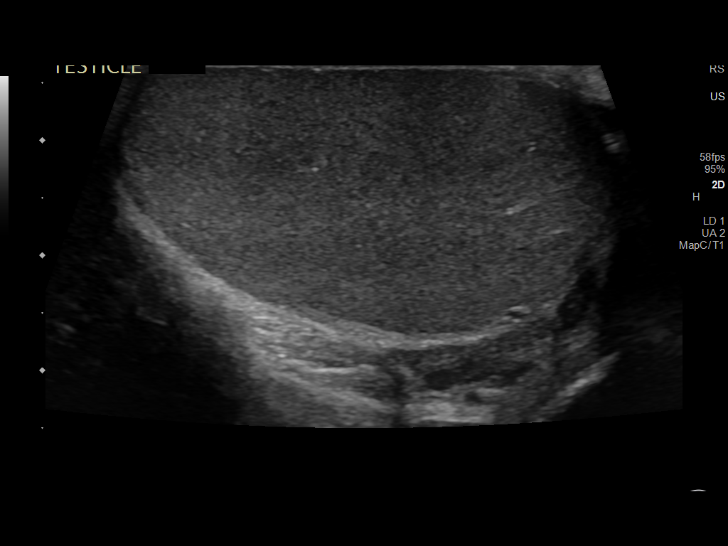
[im 40/64]
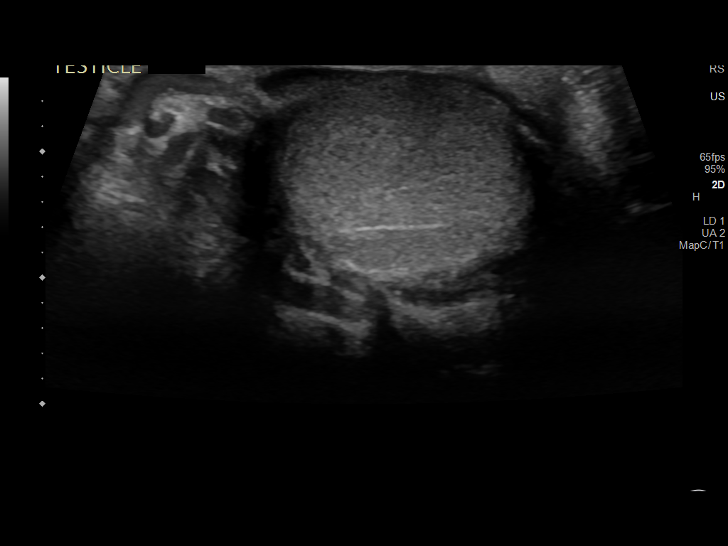
[im 43/64]
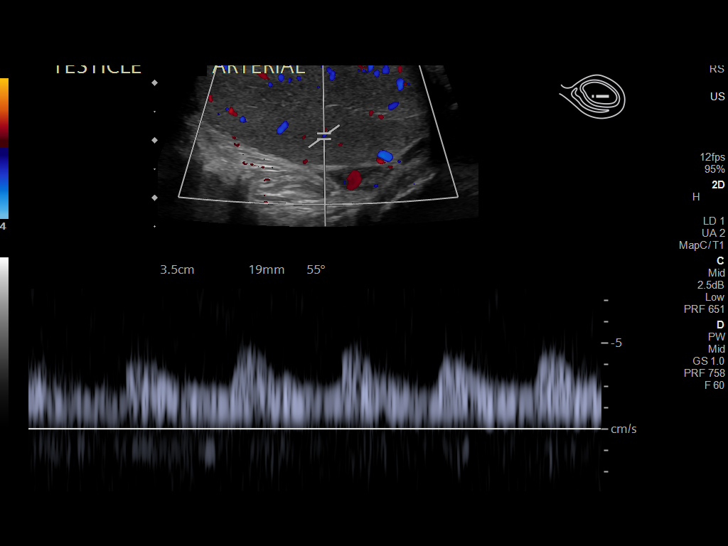
[im 48/64]
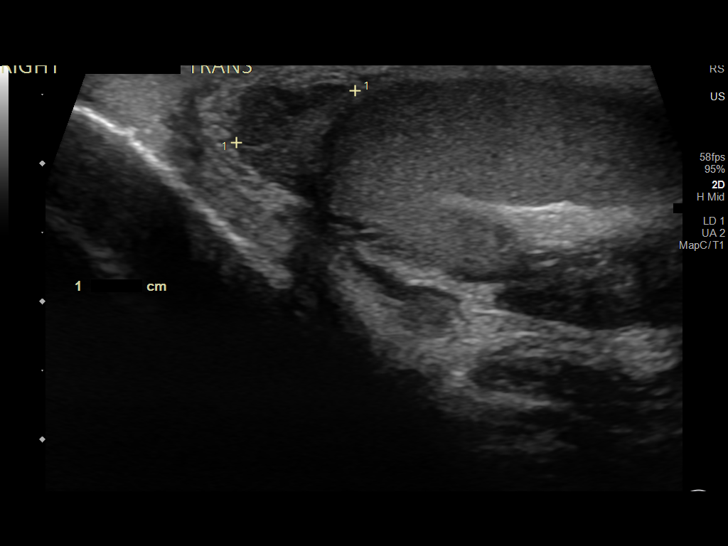
[im 53/64]
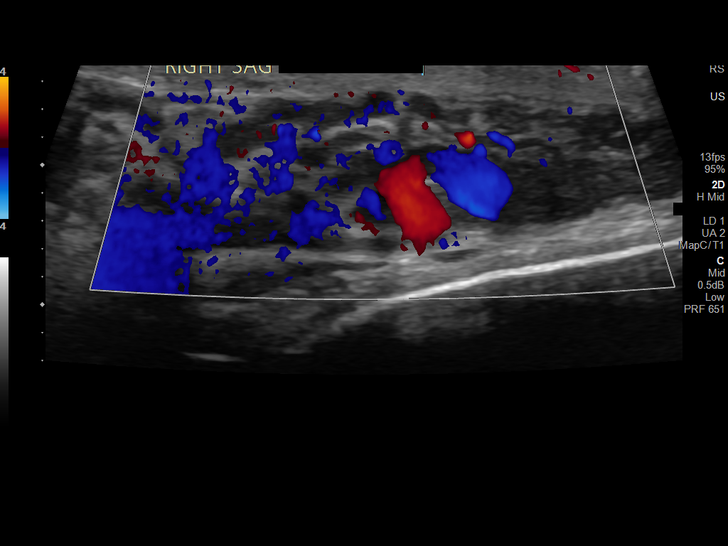
[im 58/64]
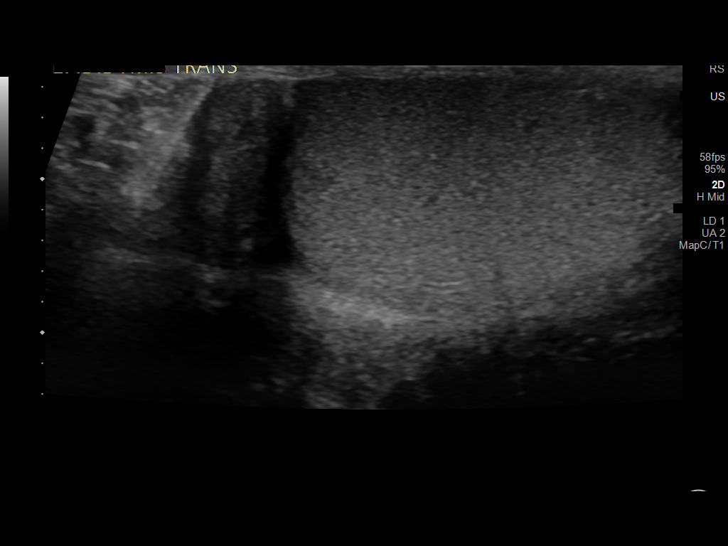
[im 64/64]
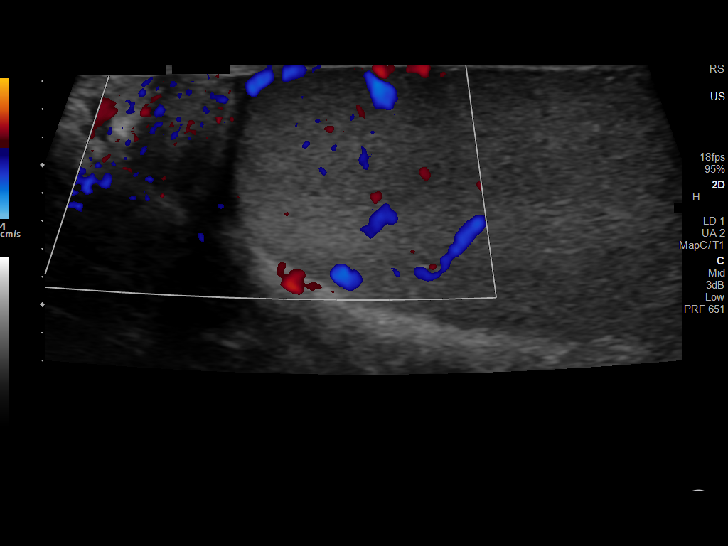

[14 of 25 positions shown; findings below may reference images not displayed]

FINDINGS: Right testicle

Measurements: 4.4 x 2.2 x 2.9 cm. No mass or microlithiasis
visualized.

Left testicle

Measurements: 4.4 x 2.4 x 3.0 cm. No mass or microlithiasis
visualized.

Right epididymis:  Normal in size and appearance.

Left epididymis:  Normal in size and appearance.

Hydrocele:  Small bilateral complex hydroceles.

Varicocele:  Bilateral varicoceles.

Pulsed Doppler interrogation of both testes demonstrates normal low
resistance arterial and venous waveforms bilaterally.
IMPRESSION: 1. Small bilateral complex hydroceles.
2. Bilateral varicoceles.
3. Normal sonographic appearance of the bilateral testicles. Normal,
symmetric bilateral arterial and venous Doppler flow.
